# Patient Record
Sex: Male | Born: 1970
Health system: Southern US, Community
[De-identification: ages and names within clinical notes are randomized; demographics above are authoritative.]

## PROBLEM LIST (undated history)

## (undated) DIAGNOSIS — I10 Essential (primary) hypertension: Secondary | ICD-10-CM

## (undated) DIAGNOSIS — G7001 Myasthenia gravis with (acute) exacerbation: Secondary | ICD-10-CM

## (undated) DIAGNOSIS — G510 Bell's palsy: Secondary | ICD-10-CM

## (undated) DIAGNOSIS — E669 Obesity, unspecified: Secondary | ICD-10-CM

## (undated) DIAGNOSIS — J45909 Unspecified asthma, uncomplicated: Secondary | ICD-10-CM

## (undated) HISTORY — DX: Bell's palsy: G51.0

## (undated) HISTORY — DX: Myasthenia gravis with (acute) exacerbation: G70.01

## (undated) HISTORY — DX: Essential (primary) hypertension: I10

## (undated) HISTORY — PX: OTHER SURGICAL HISTORY: SHX169

## (undated) HISTORY — DX: Obesity, unspecified: E66.9

---

## 2010-05-01 ENCOUNTER — Emergency Department (HOSPITAL_COMMUNITY): Payer: Self-pay

## 2010-05-01 ENCOUNTER — Emergency Department (HOSPITAL_COMMUNITY)
Admission: EM | Admit: 2010-05-01 | Discharge: 2010-05-01 | Disposition: A | Discharge status: Home / Self Care | Payer: Self-pay | Attending: Emergency Medicine | Admitting: Emergency Medicine

## 2010-05-01 DIAGNOSIS — R109 Unspecified abdominal pain: Secondary | ICD-10-CM | POA: Insufficient documentation

## 2010-05-01 DIAGNOSIS — R12 Heartburn: Secondary | ICD-10-CM | POA: Insufficient documentation

## 2010-05-01 DIAGNOSIS — R10816 Epigastric abdominal tenderness: Secondary | ICD-10-CM | POA: Insufficient documentation

## 2010-05-01 LAB — URINALYSIS, ROUTINE W REFLEX MICROSCOPIC
Glucose, UA: NEGATIVE mg/dL
Leukocytes, UA: NEGATIVE
Specific Gravity, Urine: 1.027 (ref 1.005–1.030)
pH: 5 (ref 5.0–8.0)

## 2010-05-01 LAB — DIFFERENTIAL
Basophils Relative: 0 % (ref 0–1)
Eosinophils Absolute: 0 10*3/uL (ref 0.0–0.7)
Eosinophils Relative: 0 % (ref 0–5)
Lymphs Abs: 1.3 10*3/uL (ref 0.7–4.0)
Monocytes Relative: 4 % (ref 3–12)

## 2010-05-01 LAB — CBC
MCH: 26.9 pg (ref 26.0–34.0)
MCV: 81.9 fL (ref 78.0–100.0)
Platelets: 280 10*3/uL (ref 150–400)
RDW: 14.2 % (ref 11.5–15.5)
WBC: 8.8 10*3/uL (ref 4.0–10.5)

## 2010-05-01 LAB — COMPREHENSIVE METABOLIC PANEL
Albumin: 3.9 g/dL (ref 3.5–5.2)
BUN: 7 mg/dL (ref 6–23)
Creatinine, Ser: 0.78 mg/dL (ref 0.4–1.5)
GFR calc Af Amer: >60 mL/min (ref >60)
Total Protein: 7.3 g/dL (ref 6.0–8.3)

## 2010-05-01 LAB — URINE MICROSCOPIC-ADD ON

## 2010-11-08 ENCOUNTER — Emergency Department (HOSPITAL_COMMUNITY): Payer: Self-pay

## 2010-11-08 ENCOUNTER — Emergency Department (HOSPITAL_COMMUNITY)
Admission: EM | Admit: 2010-11-08 | Discharge: 2010-11-09 | Disposition: A | Discharge status: Home / Self Care | Payer: Self-pay | Attending: Emergency Medicine | Admitting: Emergency Medicine

## 2010-11-08 DIAGNOSIS — R1013 Epigastric pain: Secondary | ICD-10-CM | POA: Insufficient documentation

## 2010-11-08 DIAGNOSIS — H538 Other visual disturbances: Secondary | ICD-10-CM | POA: Insufficient documentation

## 2010-11-08 DIAGNOSIS — X58XXXA Exposure to other specified factors, initial encounter: Secondary | ICD-10-CM | POA: Insufficient documentation

## 2010-11-08 DIAGNOSIS — R112 Nausea with vomiting, unspecified: Secondary | ICD-10-CM | POA: Insufficient documentation

## 2010-11-08 DIAGNOSIS — S058X9A Other injuries of unspecified eye and orbit, initial encounter: Secondary | ICD-10-CM | POA: Insufficient documentation

## 2010-11-08 DIAGNOSIS — R29810 Facial weakness: Secondary | ICD-10-CM | POA: Insufficient documentation

## 2010-11-08 LAB — POCT I-STAT, CHEM 8
BUN: 6 mg/dL (ref 6–23)
Calcium, Ion: 1.21 mmol/L (ref 1.12–1.32)
Chloride: 102 meq/L (ref 96–112)
Creatinine, Ser: 0.8 mg/dL (ref 0.50–1.35)
Glucose, Bld: 125 mg/dL — ABNORMAL HIGH (ref 70–99)
HCT: 48 % (ref 39.0–52.0)
Hemoglobin: 16.3 g/dL (ref 13.0–17.0)
Potassium: 4.1 meq/L (ref 3.5–5.1)
Sodium: 142 meq/L (ref 135–145)
TCO2: 28 mmol/L (ref 0–100)

## 2010-11-08 LAB — CBC
HCT: 41.4 % (ref 39.0–52.0)
Hemoglobin: 13.9 g/dL (ref 13.0–17.0)
MCH: 27.5 pg (ref 26.0–34.0)
MCHC: 33.6 g/dL (ref 30.0–36.0)
MCV: 82 fL (ref 78.0–100.0)
Platelets: 304 K/uL (ref 150–400)
RBC: 5.05 MIL/uL (ref 4.22–5.81)
RDW: 13.6 % (ref 11.5–15.5)
WBC: 9.6 K/uL (ref 4.0–10.5)

## 2010-11-08 LAB — DIFFERENTIAL
Basophils Relative: 0 % (ref 0–1)
Eosinophils Absolute: 0 10*3/uL (ref 0.0–0.7)
Monocytes Relative: 5 % (ref 3–12)
Neutrophils Relative %: 89 % — ABNORMAL HIGH (ref 43–77)

## 2010-11-09 LAB — LIPASE, BLOOD: Lipase: 14 U/L (ref 11–59)

## 2010-11-18 ENCOUNTER — Emergency Department (HOSPITAL_COMMUNITY): Payer: Self-pay

## 2010-11-18 ENCOUNTER — Emergency Department (HOSPITAL_COMMUNITY)
Admission: EM | Admit: 2010-11-18 | Discharge: 2010-11-18 | Disposition: A | Discharge status: Home / Self Care | Payer: Self-pay | Attending: Emergency Medicine | Admitting: Emergency Medicine

## 2010-11-18 DIAGNOSIS — R1013 Epigastric pain: Secondary | ICD-10-CM | POA: Insufficient documentation

## 2010-11-18 DIAGNOSIS — R112 Nausea with vomiting, unspecified: Secondary | ICD-10-CM | POA: Insufficient documentation

## 2010-11-18 DIAGNOSIS — K259 Gastric ulcer, unspecified as acute or chronic, without hemorrhage or perforation: Secondary | ICD-10-CM | POA: Insufficient documentation

## 2010-11-18 DIAGNOSIS — R12 Heartburn: Secondary | ICD-10-CM | POA: Insufficient documentation

## 2010-11-18 LAB — COMPREHENSIVE METABOLIC PANEL
AST: 13 U/L (ref 0–37)
Alkaline Phosphatase: 58 U/L (ref 39–117)
BUN: 9 mg/dL (ref 6–23)
CO2: 28 mEq/L (ref 19–32)
Chloride: 100 mEq/L (ref 96–112)
Creatinine, Ser: 0.85 mg/dL (ref 0.50–1.35)
GFR calc non Af Amer: >90 mL/min (ref >90)
Total Bilirubin: 0.7 mg/dL (ref 0.3–1.2)

## 2010-11-18 LAB — URINALYSIS, ROUTINE W REFLEX MICROSCOPIC
Ketones, ur: 15 mg/dL — AB
Nitrite: NEGATIVE
Protein, ur: NEGATIVE mg/dL
Specific Gravity, Urine: 1.023 (ref 1.005–1.030)
Urobilinogen, UA: 0.2 mg/dL (ref 0.0–1.0)

## 2010-11-18 LAB — CBC
HCT: 40.7 % (ref 39.0–52.0)
MCHC: 33.9 g/dL (ref 30.0–36.0)
MCV: 81.1 fL (ref 78.0–100.0)
RDW: 13 % (ref 11.5–15.5)

## 2010-11-18 LAB — DIFFERENTIAL
Eosinophils Relative: 0 % (ref 0–5)
Lymphocytes Relative: 10 % — ABNORMAL LOW (ref 12–46)
Lymphs Abs: 0.9 10*3/uL (ref 0.7–4.0)
Monocytes Absolute: 0.7 10*3/uL (ref 0.1–1.0)
Monocytes Relative: 7 % (ref 3–12)

## 2010-11-18 LAB — LIPASE, BLOOD: Lipase: 13 U/L (ref 11–59)

## 2012-03-04 DIAGNOSIS — G510 Bell's palsy: Secondary | ICD-10-CM | POA: Insufficient documentation

## 2012-03-06 ENCOUNTER — Other Ambulatory Visit (HOSPITAL_COMMUNITY): Payer: Self-pay | Admitting: Neurology

## 2012-03-06 DIAGNOSIS — G7000 Myasthenia gravis without (acute) exacerbation: Secondary | ICD-10-CM

## 2012-03-06 DIAGNOSIS — D4989 Neoplasm of unspecified behavior of other specified sites: Secondary | ICD-10-CM

## 2012-03-12 ENCOUNTER — Ambulatory Visit (HOSPITAL_COMMUNITY)
Admission: RE | Admit: 2012-03-12 | Discharge: 2012-03-12 | Disposition: A | Discharge status: Home / Self Care | Payer: Self-pay | Source: Ambulatory Visit | Attending: Neurology | Admitting: Neurology

## 2012-03-12 DIAGNOSIS — G7000 Myasthenia gravis without (acute) exacerbation: Secondary | ICD-10-CM | POA: Insufficient documentation

## 2012-03-12 DIAGNOSIS — D4989 Neoplasm of unspecified behavior of other specified sites: Secondary | ICD-10-CM

## 2012-03-12 MED ORDER — IOHEXOL 300 MG/ML  SOLN
80.0000 mL | Freq: Once | INTRAMUSCULAR | Status: AC | PRN
  Administered 2012-03-12: 80 mL via INTRAVENOUS

## 2012-06-18 ENCOUNTER — Encounter: Payer: Self-pay | Admitting: Neurology

## 2012-06-18 DIAGNOSIS — G510 Bell's palsy: Secondary | ICD-10-CM

## 2012-06-19 ENCOUNTER — Ambulatory Visit: Payer: Self-pay | Admitting: Neurology

## 2013-04-23 ENCOUNTER — Other Ambulatory Visit: Payer: Self-pay | Admitting: Neurology

## 2013-04-24 ENCOUNTER — Telehealth: Payer: Self-pay

## 2013-04-24 MED ORDER — PREDNISONE 20 MG PO TABS: 20.0000 mg | ORAL_TABLET | Freq: Every day | ORAL | Status: DC

## 2013-04-24 NOTE — Telephone Encounter (Signed)
I called patient. The patient has not been seen to this office since January 2014, this was the original new patient appointment, and the patient was diagnosed with myasthenia gravis. The patient is on prednisone 20 mg daily. I will give him another month on the prescription, but the patient must be seen through this office. I'll try to get a revisit set up.

## 2013-04-24 NOTE — Telephone Encounter (Signed)
[facility] on [address] is requesting a refill on Prednisone 20mg  one daily.  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  He had an appt in May 2014, but cancelled it, and has not been seen since original OV.  I attempted to contact the patient to no avail.  Would you like to fill med?  Please advise.

## 2013-04-27 ENCOUNTER — Telehealth: Payer: Self-pay | Admitting: *Deleted

## 2013-04-27 ENCOUNTER — Encounter: Payer: Self-pay | Admitting: *Deleted

## 2013-04-27 NOTE — Telephone Encounter (Signed)
A letter has been mailed to address on file. Neither contact number are working numbers.

## 2013-10-28 IMAGING — CT CT CHEST W/ CM
4 series · 18 of 30 positions shown, 19 images · IV contrast (80ml omni 300)
Comparison: Chest radiographs dated 11/08/2010

CLINICAL DATA: Newly diagnosed myasthenia gravis, evaluate for
thymoma

CT CHEST WITH CONTRAST
TECHNIQUE: Multidetector CT imaging of the chest was performed
following the standard protocol during bolus administration of
intravenous contrast.
Contrast: 80mL OMNIPAQUE IOHEXOL 300 MG/ML  SOLN

[Series 2: routine chest · axial · 0.76mm/px · z∈[-228,-98]mm · 4 of 54 slices shown, 5 images]
[im 14/54  mediastinal]
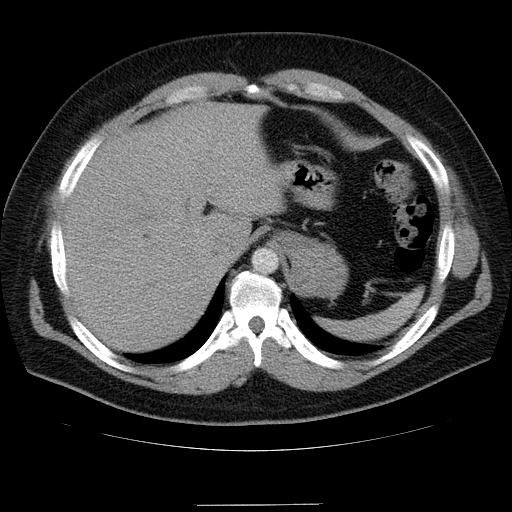
[im 14/54  lung]
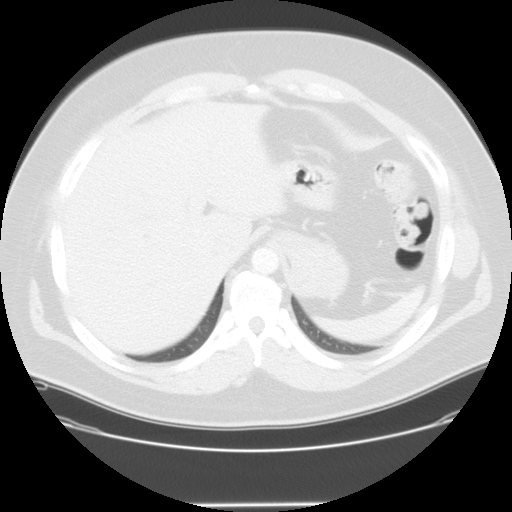
[im 27/54  lung]
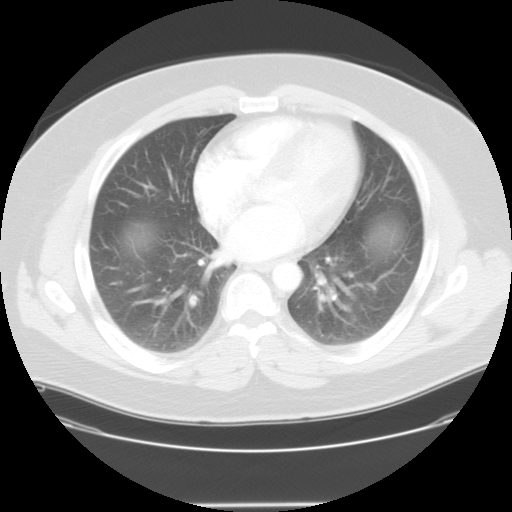
[im 32/54  lung]
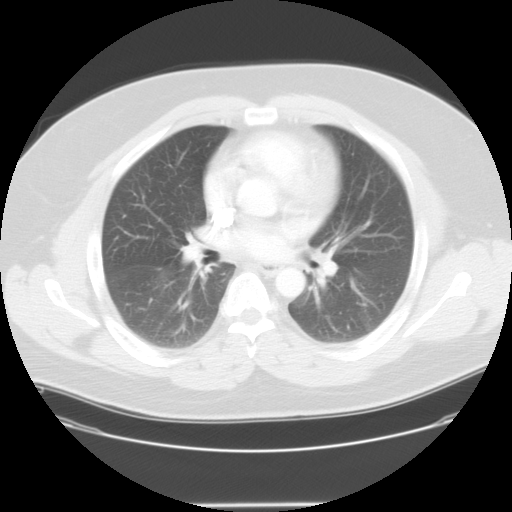
[im 40/54  lung]
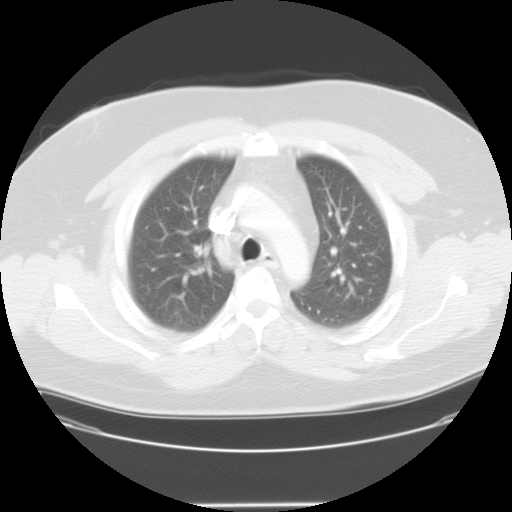

[Series 3: recon 2: routine chest · axial · 0.76mm/px · z∈[-183,-108]mm · 3 of 47 slices shown]
[im 16/47  lung]
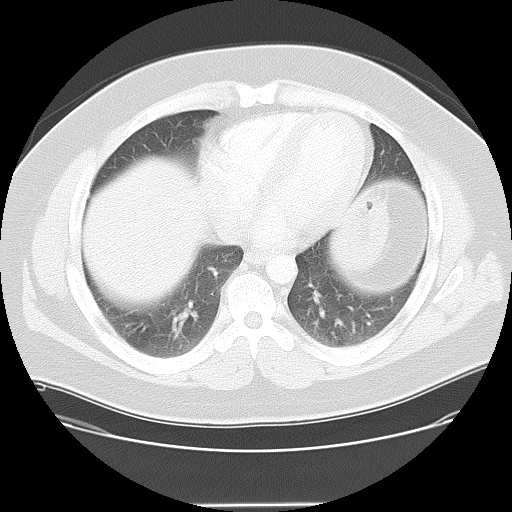
[im 25/47  lung]
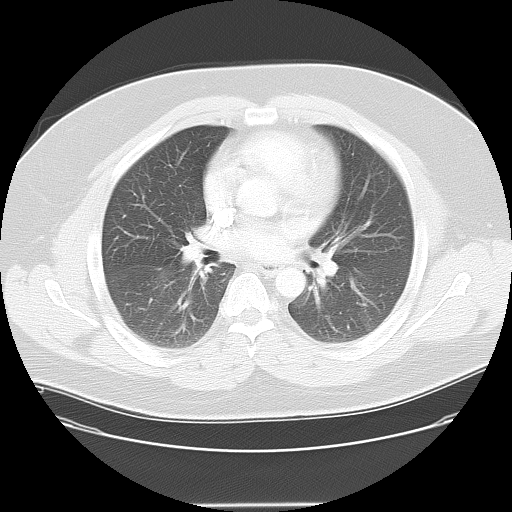
[im 31/47  lung]
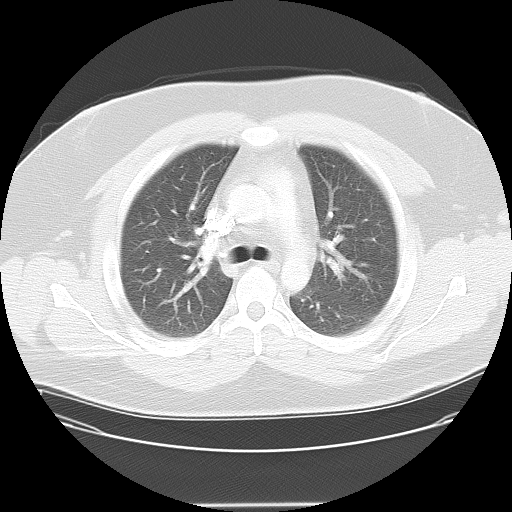

[Series 400: coronals · coronal · 0.76mm/px · 3 of 102 slices shown]
[im 13/102  lung]
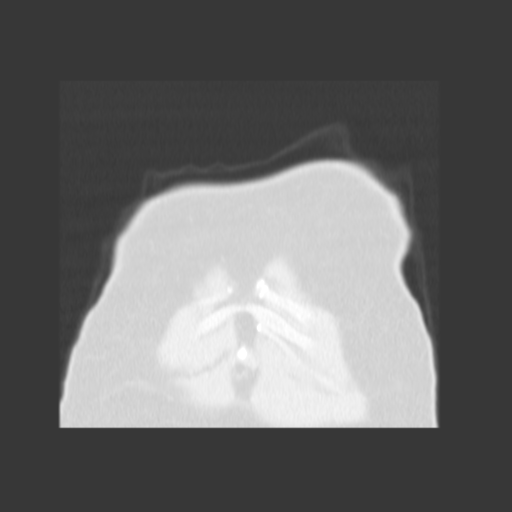
[im 26/102  lung]
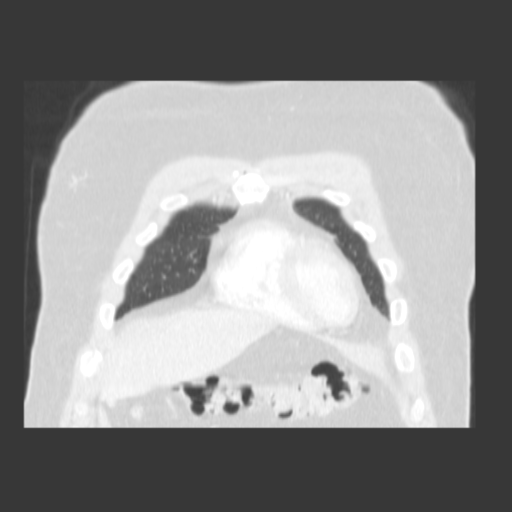
[im 38/102  lung]
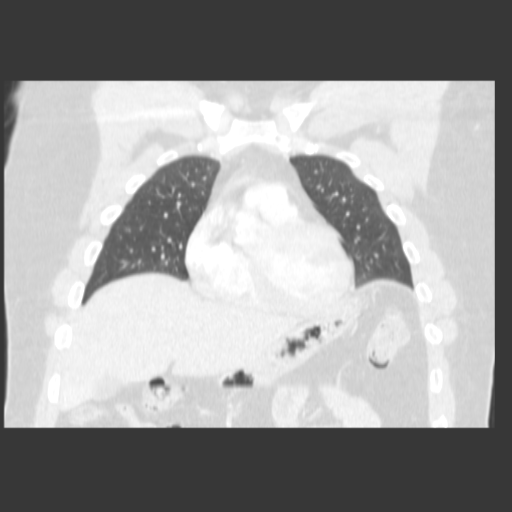

[Series 401: sagittals · sagittal · 0.76mm/px · 8 of 106 slices shown]
[im 12/106  lung]
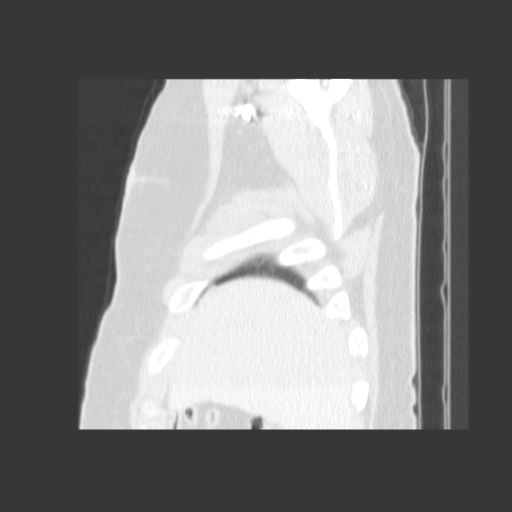
[im 24/106  lung]
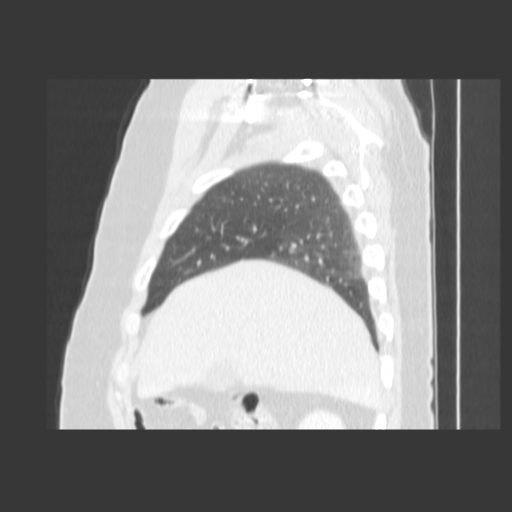
[im 36/106  lung]
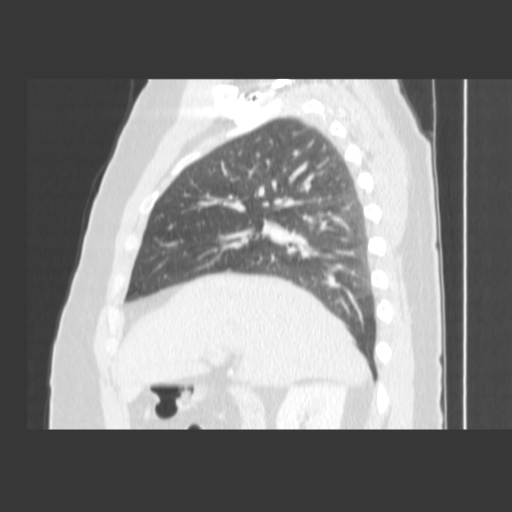
[im 47/106  lung]
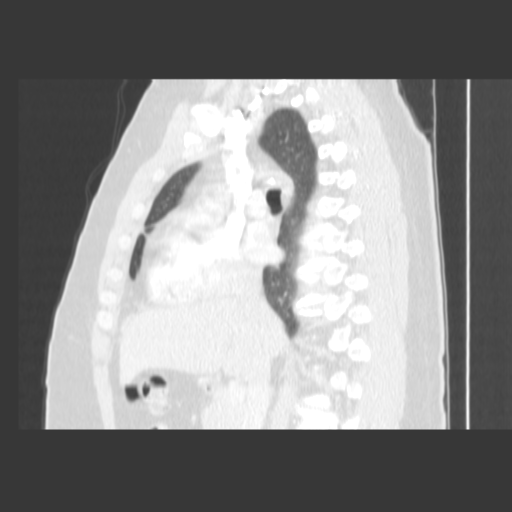
[im 59/106  lung]
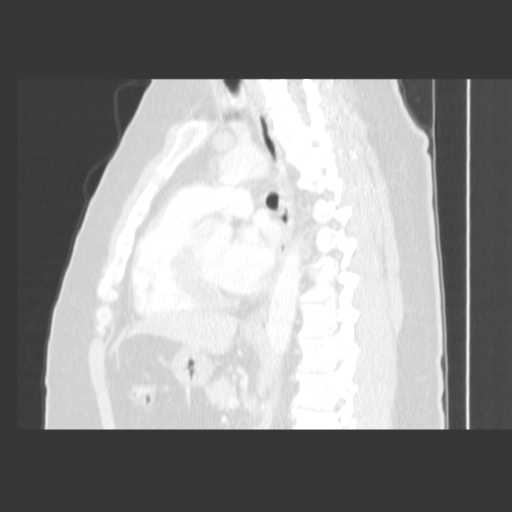
[im 71/106  lung]
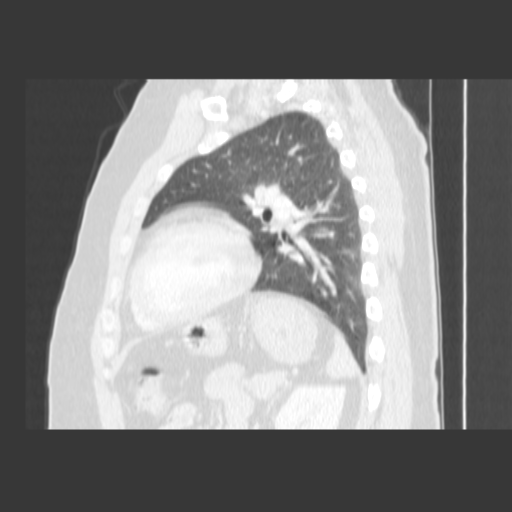
[im 82/106  lung]
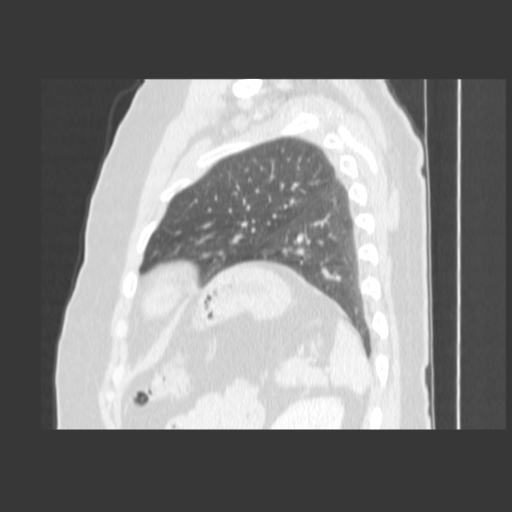
[im 94/106  lung]
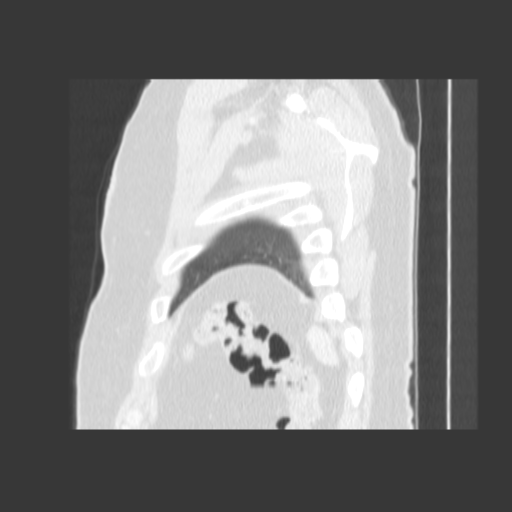

[18 of 30 positions shown; findings below may reference images not displayed]

FINDINGS: Lungs are clear.  No pulmonary nodules. No pleural
effusion or pneumothorax.

The visualized thyroid is unremarkable.

No anterior mediastinal mass or findings suspicious for thymoma.

The heart is normal in size.  No pericardial effusion.

No suspicious mediastinal or hilar lymphadenopathy.  Small
bilateral axillary lymph nodes measuring up to 10 mm on the left
(series 2/image 17), likely reactive.

Mild degenerative changes of the visualized thoracolumbar spine.
IMPRESSION: No evidence of acute cardiopulmonary disease.

Specifically, no findings to suggest thymoma.

## 2014-02-11 ENCOUNTER — Ambulatory Visit (INDEPENDENT_AMBULATORY_CARE_PROVIDER_SITE_OTHER): Payer: 59 | Admitting: Family Medicine

## 2014-02-11 VITALS — BP 150/90 | HR 87 | Temp 98.4°F | Resp 16 | Ht 66.0 in | Wt 255.0 lb

## 2014-02-11 DIAGNOSIS — I10 Essential (primary) hypertension: Secondary | ICD-10-CM

## 2014-02-11 LAB — LIPID PANEL
CHOL/HDL RATIO: 7.9 ratio
CHOLESTEROL: 309 mg/dL — AB (ref 0–200)
HDL: 39 mg/dL — AB (ref >39)
LDL CALC: 240 mg/dL — AB (ref 0–99)
TRIGLYCERIDES: 152 mg/dL — AB (ref <150)
VLDL: 30 mg/dL (ref 0–40)

## 2014-02-11 LAB — COMPREHENSIVE METABOLIC PANEL
ALBUMIN: 4.3 g/dL (ref 3.5–5.2)
ALT: 22 U/L (ref 0–53)
AST: 18 U/L (ref 0–37)
Alkaline Phosphatase: 59 U/L (ref 39–117)
BILIRUBIN TOTAL: 0.7 mg/dL (ref 0.2–1.2)
BUN: 10 mg/dL (ref 6–23)
CALCIUM: 9.8 mg/dL (ref 8.4–10.5)
CO2: 31 meq/L (ref 19–32)
CREATININE: 0.88 mg/dL (ref 0.50–1.35)
Chloride: 102 mEq/L (ref 96–112)
GLUCOSE: 93 mg/dL (ref 70–99)
POTASSIUM: 4.8 meq/L (ref 3.5–5.3)
Sodium: 139 mEq/L (ref 135–145)
Total Protein: 7.8 g/dL (ref 6.0–8.3)

## 2014-02-11 MED ORDER — LISINOPRIL-HYDROCHLOROTHIAZIDE 10-12.5 MG PO TABS: 1.0000 | ORAL_TABLET | Freq: Every day | ORAL | Status: DC

## 2014-02-11 NOTE — Progress Notes (Addendum)
  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 He is on vacation the next week.  Objective: Blood pressure is noted. Chest clear. Heart regular without murmurs.  Assessment: Hypertension unsatisfactory control  Plan: Lisinopril HCT Work hard on weight loss, exercise, and salt reduction.

## 2014-02-11 NOTE — Patient Instructions (Addendum)
Take caution with your diet as we discussed  Get regular exercise  Avoid excessive salt  Take the lisinopril HCT one pill every morning for the blood pressure.  Return next Wednesday between 8 AM and 2 PM and asked for Dr. Linna Darner      Hypertension Hypertension, commonly called high blood pressure, is when the force of blood pumping through your arteries is too strong. Your arteries are the blood vessels that carry blood from your heart throughout your body. A blood pressure reading consists of a higher number over a lower number, such as 110/72. The higher number (systolic) is the pressure inside your arteries when your heart pumps. The lower number (diastolic) is the pressure inside your arteries when your heart relaxes. Ideally you want your blood pressure below 120/80. Hypertension forces your heart to work harder to pump blood. Your arteries may become narrow or stiff. Having hypertension puts you at risk for heart disease, stroke, and other problems.  RISK FACTORS Some risk factors for high blood pressure are controllable. Others are not.  Risk factors you cannot control include:   Race. You may be at higher risk if you are African American.  Age. Risk increases with age.  Gender. Men are at higher risk than women before age 83 years. After age 59, women are at higher risk than men. Risk factors you can control include:  Not getting enough exercise or physical activity.  Being overweight.  Getting too much fat, sugar, calories, or salt in your diet.  Drinking too much alcohol. SIGNS AND SYMPTOMS Hypertension does not usually cause signs or symptoms. Extremely high blood pressure (hypertensive crisis) may cause headache, anxiety, shortness of breath, and nosebleed. DIAGNOSIS  To check if you have hypertension, your health care provider will measure your blood pressure while you are seated, with your arm held at the level of your heart. It should be measured at least twice  using the same arm. Certain conditions can cause a difference in blood pressure between your right and left arms. A blood pressure reading that is higher than normal on one occasion does not mean that you need treatment. If one blood pressure reading is high, ask your health care provider about having it checked again. TREATMENT  Treating high blood pressure includes making lifestyle changes and possibly taking medicine. Living a healthy lifestyle can help lower high blood pressure. You may need to change some of your habits. Lifestyle changes may include:  Following the DASH diet. This diet is high in fruits, vegetables, and whole grains. It is low in salt, red meat, and added sugars.  Getting at least 2 hours of brisk physical activity every week.  Losing weight if necessary.  Not smoking.  Limiting alcoholic beverages.  Learning ways to reduce stress. If lifestyle changes are not enough to get your blood pressure under control, your health care provider may prescribe medicine. You may need to take more than one. Work closely with your health care provider to understand the risks and benefits. HOME CARE INSTRUCTIONS  Have your blood pressure rechecked as directed by your health care provider.   Take medicines only as directed by your health care provider. Follow the directions carefully. Blood pressure medicines must be taken as prescribed. The medicine does not work as well when you skip doses. Skipping doses also puts you at risk for problems.   Do not smoke.   Monitor your blood pressure at home as directed by your health care provider. [place]  CARE IF:   You think you are having a reaction to medicines taken.  You have recurrent headaches or feel dizzy.  You have swelling in your ankles.  You have trouble with your vision. SEEK IMMEDIATE MEDICAL CARE IF:  You develop a severe headache or confusion.  You have unusual weakness, numbness, or feel faint.  You  have severe chest or abdominal pain.  You vomit repeatedly.  You have trouble breathing. MAKE SURE YOU:   Understand these instructions.  Will watch your condition.  Will get help right away if you are not doing well or get worse. Document Released: 01/29/2005 Document Revised: 06/15/2013 Document Reviewed: 11/21/2012 [facility] Patient Information 2015 [place], [place]. This information is not intended to replace advice given to you by your health care provider. Make sure you discuss any questions you have with your health care provider.

## 2014-02-12 LAB — TSH: TSH: 0.922 u[IU]/mL (ref 0.350–4.500)

## 2014-02-17 ENCOUNTER — Ambulatory Visit (INDEPENDENT_AMBULATORY_CARE_PROVIDER_SITE_OTHER): Payer: 59 | Admitting: Family Medicine

## 2014-02-17 VITALS — BP 124/88 | HR 85 | Temp 98.7°F | Resp 20 | Ht 65.5 in | Wt 251.4 lb

## 2014-02-17 DIAGNOSIS — I10 Essential (primary) hypertension: Secondary | ICD-10-CM

## 2014-02-17 MED ORDER — LISINOPRIL-HYDROCHLOROTHIAZIDE 20-25 MG PO TABS: 1.0000 | ORAL_TABLET | Freq: Every day | ORAL | Status: DC

## 2014-02-17 NOTE — Patient Instructions (Signed)
Change the blood pressure medicine to lisinopril/HCT 20/25 (210/12.5). When you run out of the 10 mg pill pick up the 20 mg pills.   Return in about one month and see Dr. Nyoka Cowden. It would be good if you can get an appointment to see him in the appointment center next door.   Wait until about Friday to go try to past year test. If there are any problems let me know.

## 2014-02-17 NOTE — Progress Notes (Signed)
Subjective patient was here last week for his blood pressure. 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 He has been taking lisinopril HCT 10/12.5 every morning. 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 Otherwise is tolerating the medicine well.  Objective: Blood pressure a little elevated when he came in, on repeat 124/88.  Assessment: Hypertension, still somewhat borderline  Plan: Increase the lisinopril HCT to 20/25. He is going to double up on the low-dose pills for the next couple weeks and see how they are doing before getting the prescription for the new pills filled. I think after another 3 days or so on this dose that he should be able to pass his test hopefully. If he is not doing better, we may have to add some amlodipine.  He has chosen Dr. Carlota Raspberry as his primary care doctor, so suggested that he make an appointment to see him in 1 month.

## 2014-03-15 ENCOUNTER — Ambulatory Visit (INDEPENDENT_AMBULATORY_CARE_PROVIDER_SITE_OTHER): Payer: 59 | Admitting: Emergency Medicine

## 2014-03-15 VITALS — BP 132/90 | HR 90 | Temp 98.6°F | Resp 18 | Ht 67.0 in | Wt 258.0 lb

## 2014-03-15 DIAGNOSIS — G510 Bell's palsy: Secondary | ICD-10-CM

## 2014-03-15 DIAGNOSIS — I10 Essential (primary) hypertension: Secondary | ICD-10-CM

## 2014-03-15 MED ORDER — PREDNISONE 20 MG PO TABS: ORAL_TABLET | ORAL | Status: DC

## 2014-03-15 NOTE — Progress Notes (Signed)
Subjective:    Patient ID: Brent Cruz, male    DOB: [birth year], 44 y.o.   MRN: [id number]  HPI  This is a 44 year old male with PMH HTN and bells palsy who is presenting with left eyelid drooping x 2 days. He was diagnosed with bells palsy about 2 years ago and states he his facial strength never got back to normal although it did improve slightly from initial diagnosis. Since his diagnosis he has had a few "flares" where his eyelid "starts to feel lazy". He states for the past 2 days his left eyelid is drooping more and when he is tired his eyelid droops more and affects his vision. His other facial muscles have not been more affected in the past few days. He states in the past he has been prescribed prednisone and pyridostigmine which have been helpful. He is a Administrator. He denies blurry vision or eye pain. He has never had a scan of his head or seen a neurologist.  He also reports a few months back at his DOT exam he was diagnosed with HTN and put on lisinopril-HCTZ. He is wondering if he needs to keep taking that medication. He states he is taking the BP med inconsistently.   Review of Systems  Constitutional: Negative for fever and chills.  HENT: Negative for drooling, ear pain, trouble swallowing and voice change.   Eyes: Negative for pain, discharge and visual disturbance.  Gastrointestinal: Negative for nausea and vomiting.  Skin: Negative for rash.    Patient Active Problem List   Diagnosis Date Noted  . Essential hypertension 03/15/2014  . Bell's palsy 03/04/2012   Prior to Admission medications   Medication Sig Start Date End Date Taking? Authorizing Provider  lisinopril-hydrochlorothiazide (PRINZIDE,ZESTORETIC) 10-12.5 MG per tablet Take 1 tablet by mouth daily. 02/11/14  Yes Posey Boyer, MD  lisinopril-hydrochlorothiazide (PRINZIDE,ZESTORETIC) 20-25 MG per tablet Take 1 tablet by mouth daily. 02/17/14  Yes Posey Boyer, MD   No Known Allergies  Patient's social  and family history were reviewed.      Objective:   Physical Exam  Constitutional: He is oriented to person, place, and time. He appears well-developed and well-nourished. No distress.  HENT:  Head: Normocephalic and atraumatic.  Right Ear: Hearing, tympanic membrane, external ear and ear canal normal.  Left Ear: Hearing, tympanic membrane, external ear and ear canal normal.  Nose: Nose normal.  Mouth/Throat: Uvula is midline, oropharynx is clear and moist and mucous membranes are normal.  Eyes: Conjunctivae and EOM are normal. Pupils are equal, round, and reactive to light. Right eye exhibits no discharge. Left eye exhibits no discharge. No scleral icterus.  Left upper lid ptosis   Neck: Trachea normal.  Cardiovascular: Normal rate, regular rhythm, normal heart sounds, intact distal pulses and normal pulses.   No murmur heard. Pulmonary/Chest: Effort normal and breath sounds normal. No respiratory distress. He has no wheezes. He has no rhonchi. He has no rales.  Musculoskeletal: Normal range of motion.  Lymphadenopathy:       Head (right side): No submental, no submandibular and no tonsillar adenopathy present.       Head (left side): No submental, no submandibular and no tonsillar adenopathy present.    He has no cervical adenopathy.  Neurological: He is alert and oriented to person, place, and time. A cranial nerve deficit is present. No sensory deficit. Coordination and gait normal.  Left sided facial weakness in all CNVII distributions, 2-3/5 strength.  Skin:  Skin is warm, dry and intact. No lesion and no rash noted.  Psychiatric: He has a normal mood and affect. His speech is normal and behavior is normal. Thought content normal.   BP 144/90 mmHg  Pulse 90  Temp(Src) 98.6 F (37 C) (Oral)  Resp 18  Ht 5\' 7"  (1.702 m)  Wt 258 lb (117.028 kg)  BMI 40.40 kg/m2  SpO2 99%     Assessment & Plan:  1. Essential hypertension BP 144/90 on arrival, recheck 132/90. Advised pt to  take BP medication consistently and check BP at home and return if BP consistently >140/90.  2. Bell's palsy Prescribed prednisone as this has helped before. D/t residual facial weakness will send to neurology for evaluation and work up. He most likely needs a scan to rule out other causes.  - predniSONE (DELTASONE) 20 MG tablet; Take 3 PO QAM x3days, 2 PO QAM x3days, 1 PO QAM x3days  Dispense: 18 tablet; Refill: 0 - Ambulatory referral to Neurology   Benjaman Pott. Drenda Freeze, M[facility]  03/15/2014

## 2014-03-15 NOTE — Progress Notes (Signed)
  Medical screening examination/treatment/procedure(s) were performed by non-physician practitioner and as supervising physician I was immediately available for consultation/collaboration.     

## 2014-03-15 NOTE — Patient Instructions (Signed)
Take prednisone until finished. Use lubricating eye drops for dry eyes and before sleep. Keep taking blood pressure medication daily. You will get a phone call to make appointment with neurology. Return with any problems/concerns.

## 2014-03-30 ENCOUNTER — Encounter: Payer: Self-pay | Admitting: Neurology

## 2014-03-30 ENCOUNTER — Ambulatory Visit (INDEPENDENT_AMBULATORY_CARE_PROVIDER_SITE_OTHER): Payer: 59 | Admitting: Neurology

## 2014-03-30 VITALS — BP 152/92 | HR 114 | Ht 65.0 in | Wt 253.2 lb

## 2014-03-30 DIAGNOSIS — G7001 Myasthenia gravis with (acute) exacerbation: Secondary | ICD-10-CM

## 2014-03-30 HISTORY — DX: Myasthenia gravis with (acute) exacerbation: G70.01

## 2014-03-30 MED ORDER — PREDNISONE 20 MG PO TABS: 20.0000 mg | ORAL_TABLET | Freq: Every day | ORAL | Status: DC

## 2014-03-30 MED ORDER — PYRIDOSTIGMINE BROMIDE 60 MG PO TABS: 30.0000 mg | ORAL_TABLET | Freq: Three times a day (TID) | ORAL | Status: DC

## 2014-03-30 MED ORDER — PREDNISONE 5 MG PO TABS: 15.0000 mg | ORAL_TABLET | Freq: Every day | ORAL | Status: DC

## 2014-03-30 NOTE — Patient Instructions (Addendum)
  With the prednisone, stay on the 20 mg for 6 weeks, then we will go to 15 mg daily (3 of the 5 mg tablets daily), I will follow-up in 3 or 4 months. You can take the Mestinon one half tablet 3 times daily. Call me if you have any problems. Watch out for diarrhea or stomach cramps on the Mestinon.  Myasthenia Gravis Myasthenia gravis is a disease that causes muscle weakness throughout the body. The muscles affected are the ones we can control (voluntary muscles). An example of a voluntary muscle is your hand muscles. You can control the muscles to make the hand pick something up. An example of an involuntary muscle is the heart. The heart beats without any direction from you.  Myasthenia Gravis is thought to be an autoimmune disease. That means that normal defenses of the body begin to attack the body. In this case, the immune system begins to attack cells located at the junctions of the muscles and the nerves. Women are affected more often. Women are affected at a younger age than men. Babies born to affected women frequently develop symptoms at an early age. SYMPTOMS Initially in the disease, the facial muscles are affected first. After this, a person may develop droopy eyelids. They may have difficulty controlling facial muscles. They may have problems chewing. Swallowing and speaking may become impaired. The weakness gradually spreads to the arms and legs. It begins to affect breathing. Sometimes, the symptoms lessen or go away without any apparent cause. DIAGNOSIS  Diagnosis can be made with blood tests. Tests such as electromyography may be done to examine the electrical activity in the muscle. An improvement in symptoms after having an anti-cholinesterase drug helps confirm the diagnosis.  TREATMENT  Medicines are usually prescribed as the first treatment. These medicines help, but they do not cure the disease. A plasma cleansing procedure (plasmapheresis) can be used to treat a crisis. It can also  be used to prepare a person for surgery. This procedure produces short-term improvement. Some cases are helped by removing the thymus gland. Steroids are used for short-term benefits. Document Released: 05/07/2000 Document Revised: 04/23/2011 Document Reviewed: 04/01/2013 [facility] Patient Information 2015 [place], [place]. This information is not intended to replace advice given to you by your health care provider. Make sure you discuss any questions you have with your health care provider.

## 2014-03-30 NOTE — Progress Notes (Signed)
Reason for visit: Myasthenia gravis  Brent Cruz is an 44 y.o. male  History of present illness:  Brent Cruz is a 44 year old right-handed white male with a history of myasthenia gravis. He has had a left-sided Bell's palsy, but he developed recurring weakness and was seen in January 2014. The patient was found to have elevated acetylcholine receptor antibody levels, and a CT scan of the chest was unremarkable. The patient placed on prednisone and Mestinon, but he was lost to follow-up. The patient apparently has not been on any medication for least a year or year and a half. He has done quite well until about 2 weeks ago when he had recurrence of left facial weakness. He has had ptosis of the left eye that may close that eye. The patient denies any overt double vision. He denies any weakness of the extremities. He has not had any weakness with chewing, or change in speech or swallowing. He returns for an evaluation. He is back on prednisone, currently on 20 mg daily. The prednisone has improved his symptoms.  Past Medical History  Diagnosis Date  . Bell's palsy     Recurrent  . Obesity   . Hypertension   . Myasthenia gravis with exacerbation 03/30/2014    History reviewed. No pertinent past surgical history.  Family History  Problem Relation Age of Onset  . Glaucoma Father   . Diabetes Paternal Grandfather     Social history:  reports that he quit smoking about 25 years ago. He has never used smokeless tobacco. He reports that he drinks alcohol. He reports that he does not use illicit drugs.   No Known Allergies  Medications:  Current Outpatient Prescriptions on File Prior to Visit  Medication Sig Dispense Refill  . lisinopril-hydrochlorothiazide (PRINZIDE,ZESTORETIC) 10-12.5 MG per tablet Take 1 tablet by mouth daily. 30 tablet 3   No current facility-administered medications on file prior to visit.    ROS:  Out of a complete 14 system review of symptoms, the patient  complains only of the following symptoms, and all other reviewed systems are negative.  Blurred vision, double vision  Blood pressure 152/92, pulse 114, height 5\' 5"  (1.651 m), weight 253 lb 3.2 oz (114.851 kg).  Physical Exam  General: The patient is alert and cooperative at the time of the examination. The patient is moderately obese.  Skin: No significant peripheral edema is noted.   Neurologic Exam  Mental status: The patient is oriented x 3.  Cranial nerves: Facial symmetry is not present. There is facial asymmetry, mild left facial weakness. Speech is normal, no aphasia or dysarthria is noted. Extraocular movements are full. Visual fields are full. With superior gaze for 1 minute, no increase in left-sided ptosis or divergence of gaze was seen. No subjective double vision was noted.  Motor: The patient has good strength in all 4 extremities. With arms outstretched 1 minute, no fatigable weakness of the deltoid muscles was noted.  Sensory examination: Soft touch sensation on the face, arms, and legs was symmetric.  Coordination: The patient has good finger-nose-finger and heel-to-shin bilaterally.  Gait and station: The patient has a normal gait. Tandem gait is normal. Romberg is negative. No drift is seen.  Reflexes: Deep tendon reflexes are symmetric.   Assessment/Plan:  1. Myasthenia gravis  The patient has had minimal features with myasthenia, mainly associated with ptosis involving the left eye. The patient is back on prednisone, he will continue on 20 mg dose for least 6 weeks,  and then drop the dose to 15 mg. The patient will be given Mestinon taking 30 mg 3 times daily. He will follow-up in about 3-4 months. We will try to get the prednisone dose as low as possible.   Brent Alexanders MD 03/30/2014 6:54 PM  [facility] [address] [place], [place] [address]  Phone [phone] Fax [phone]

## 2014-09-27 ENCOUNTER — Ambulatory Visit: Payer: 59 | Admitting: Neurology

## 2014-09-27 ENCOUNTER — Telehealth: Payer: Self-pay

## 2014-09-27 NOTE — Telephone Encounter (Signed)
Patient did not come to a f/u appointment today.  

## 2014-10-05 ENCOUNTER — Encounter: Payer: Self-pay | Admitting: Neurology

## 2014-12-13 ENCOUNTER — Other Ambulatory Visit: Payer: Self-pay | Admitting: Family Medicine

## 2015-02-23 ENCOUNTER — Other Ambulatory Visit: Payer: Self-pay | Admitting: Family Medicine

## 2015-02-24 ENCOUNTER — Other Ambulatory Visit: Payer: Self-pay | Admitting: Family Medicine

## 2015-02-26 ENCOUNTER — Other Ambulatory Visit: Payer: Self-pay | Admitting: Family Medicine

## 2015-02-28 ENCOUNTER — Other Ambulatory Visit: Payer: Self-pay | Admitting: Family Medicine

## 2015-05-24 ENCOUNTER — Ambulatory Visit (INDEPENDENT_AMBULATORY_CARE_PROVIDER_SITE_OTHER): Payer: BLUE CROSS/BLUE SHIELD | Admitting: Family Medicine

## 2015-05-24 VITALS — BP 134/86 | HR 104 | Temp 99.3°F | Resp 18 | Ht 66.0 in | Wt 291.0 lb

## 2015-05-24 DIAGNOSIS — K439 Ventral hernia without obstruction or gangrene: Secondary | ICD-10-CM

## 2015-05-24 DIAGNOSIS — E785 Hyperlipidemia, unspecified: Secondary | ICD-10-CM

## 2015-05-24 DIAGNOSIS — I10 Essential (primary) hypertension: Secondary | ICD-10-CM

## 2015-05-24 DIAGNOSIS — R635 Abnormal weight gain: Secondary | ICD-10-CM | POA: Diagnosis not present

## 2015-05-24 LAB — POCT GLYCOSYLATED HEMOGLOBIN (HGB A1C): HEMOGLOBIN A1C: 5.9

## 2015-05-24 MED ORDER — LISINOPRIL-HYDROCHLOROTHIAZIDE 10-12.5 MG PO TABS: 1.0000 | ORAL_TABLET | Freq: Every day | ORAL | Status: DC

## 2015-05-24 NOTE — Patient Instructions (Addendum)
It is most important that you work harder on losing weight.   Exercise with a good walk for about 30 minutes at least 5 days a week   Cut out liquid calories   Cut out snacks   Save desserts for special occasions   Decrease your portions, especially of carbohydrates (bread., Pasta, potatoes, rice, etc.) And avoid excess fried and fatty foods   Be cautious when you eat out, pick restaurant square you will not be tempted overeat  Take your blood pressure medicine faithfully  We will let you know the results of your labs  Return in 3-4 months for a follow-up visit.     IF you received an x-ray today, you will receive an invoice from [facility]. Please contact [facility] at [phone] with questions or concerns regarding your invoice.   IF you received labwork today, you will receive an invoice from [employer]. Please contact [facility] at [phone] with questions or concerns regarding your invoice.   Our billing staff will not be able to assist you with questions regarding bills from these companies.  You will be contacted with the lab results as soon as they are available. The fastest way to get your results is to activate your My Chart account. Instructions are located on the last page of this paperwork. If you have not heard from [place] regarding the results in 2 weeks, please contact this office.

## 2015-05-24 NOTE — Progress Notes (Addendum)
Patient ID: Brent Cruz, male    DOB: 1971/01/25  Age: 45 y.o. MRN: [id number]  Chief Complaint  Patient presents with  . Follow-up    blood pressure    Subjective:   Patient is here for a blood pressure check in a form to be filled out saying that his blood pressure is okay. 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 He does not get a lot of exercise. He eats with his parents most of the time. Denies any headaches or dizziness or chest pains or shortness of breath. No GI or GU complaints. No musculoskeletal complaints.  Current allergies, medications, problem list, past/family and social histories reviewed.  Objective:  BP 134/86 mmHg  Pulse 104  Temp(Src) 99.3 F (37.4 C) (Oral)  Resp 18  Ht 5\' 6"  (1.676 m)  Wt 291 lb (131.997 kg)  BMI 46.99 kg/m2  SpO2 97%  Pleasant gentleman, alert and oriented. Eyes PERRLA. Neck supple without nodes or thyromegaly. Chest is clear to auscultation. Heart regular without any murmurs. He has a small ventral hernia when he strains. He has a large abdomen is morbidly obese. No edema.  Assessment & Plan:   Assessment: 1. Essential hypertension   2. Morbid obesity, unspecified obesity type ([place])   3. Weight gain   4. Ventral hernia without obstruction or gangrene   5. Hyperlipidemia       Plan: Hypertension, out of his medications, but his blood pressure readings are borderline satisfactory. Signed the form. It is very important that he gets back on his blood pressure medicine so that he doesn't run these borderline numbers.  Orders Placed This Encounter  Procedures  . Comprehensive metabolic panel  . Lipid panel  . POCT glycosylated hemoglobin (Hb A1C)    Meds ordered this encounter  Medications  . lisinopril-hydrochlorothiazide (PRINZIDE,ZESTORETIC) 10-12.5 MG tablet    Sig: Take 1 tablet by mouth daily.    Dispense:  90 tablet    Refill:  3    Results for orders placed or performed in visit on 05/24/15   Comprehensive metabolic panel  Result Value Ref Range   Sodium 143 135 - 146 mmol/L   Potassium 4.2 3.5 - 5.3 mmol/L   Chloride 104 98 - 110 mmol/L   CO2 32 (H) 20 - 31 mmol/L   Glucose, Bld 100 (H) 65 - 99 mg/dL   BUN 9 7 - 25 mg/dL   Creat 0.78 0.60 - 1.35 mg/dL   Total Bilirubin 0.4 0.2 - 1.2 mg/dL   Alkaline Phosphatase 50 40 - 115 U/L   AST 12 10 - 40 U/L   ALT 14 9 - 46 U/L   Total Protein 7.0 6.1 - 8.1 g/dL   Albumin 3.9 3.6 - 5.1 g/dL   Calcium 9.3 8.6 - 10.3 mg/dL  Lipid panel  Result Value Ref Range   Cholesterol 248 (H) 125 - 200 mg/dL   Triglycerides 228 (H) <150 mg/dL   HDL 34 (L) >=40 mg/dL   Total CHOL/HDL Ratio 7.3 (H) <=5.0 Ratio   VLDL 46 (H) <30 mg/dL   LDL Cholesterol 168 (H) <130 mg/dL  POCT glycosylated hemoglobin (Hb A1C)  Result Value Ref Range   Hemoglobin A1C 5.9         Patient Instructions   It is most important that you work harder on losing weight.   Exercise with a good walk for about 30 minutes at least 5 days a week   Cut out liquid calories  Cut out snacks   Save desserts for special occasions   Decrease your portions, especially of carbohydrates (bread., Pasta, potatoes, rice, etc.) And avoid excess fried and fatty foods   Be cautious when you eat out, pick restaurant square you will not be tempted overeat  Take your blood pressure medicine faithfully  We will let you know the results of your labs  Return in 3-4 months for a follow-up visit.     IF you received an x-ray today, you will receive an invoice from [facility]. Please contact [facility] at [phone] with questions or concerns regarding your invoice.   IF you received labwork today, you will receive an invoice from [employer]. Please contact [facility] at [phone] with questions or concerns regarding your invoice.   Our billing staff will not be able to assist you with questions regarding bills from these  companies.  You will be contacted with the lab results as soon as they are available. The fastest way to get your results is to activate your My Chart account. Instructions are located on the last page of this paperwork. If you have not heard from [place] regarding the results in 2 weeks, please contact this office.         Return in about 4 months (around 09/23/2015).   HOPPER,DAVID, MD 05/27/2015

## 2015-05-25 LAB — COMPREHENSIVE METABOLIC PANEL
ALT: 14 U/L (ref 9–46)
AST: 12 U/L (ref 10–40)
Albumin: 3.9 g/dL (ref 3.6–5.1)
Alkaline Phosphatase: 50 U/L (ref 40–115)
BILIRUBIN TOTAL: 0.4 mg/dL (ref 0.2–1.2)
BUN: 9 mg/dL (ref 7–25)
CHLORIDE: 104 mmol/L (ref 98–110)
CO2: 32 mmol/L — AB (ref 20–31)
Calcium: 9.3 mg/dL (ref 8.6–10.3)
Creat: 0.78 mg/dL (ref 0.60–1.35)
GLUCOSE: 100 mg/dL — AB (ref 65–99)
POTASSIUM: 4.2 mmol/L (ref 3.5–5.3)
Sodium: 143 mmol/L (ref 135–146)
Total Protein: 7 g/dL (ref 6.1–8.1)

## 2015-05-25 LAB — LIPID PANEL
Cholesterol: 248 mg/dL — ABNORMAL HIGH (ref 125–200)
HDL: 34 mg/dL — AB (ref >=40)
LDL CALC: 168 mg/dL — AB (ref <130)
TRIGLYCERIDES: 228 mg/dL — AB (ref <150)
Total CHOL/HDL Ratio: 7.3 Ratio — ABNORMAL HIGH (ref <=5.0)
VLDL: 46 mg/dL — AB (ref <30)

## 2015-05-27 ENCOUNTER — Other Ambulatory Visit: Payer: Self-pay | Admitting: Family Medicine

## 2015-05-27 DIAGNOSIS — E785 Hyperlipidemia, unspecified: Secondary | ICD-10-CM

## 2015-05-27 MED ORDER — ROSUVASTATIN CALCIUM 5 MG PO TABS: 5.0000 mg | ORAL_TABLET | Freq: Every day | ORAL | Status: DC

## 2015-06-02 ENCOUNTER — Ambulatory Visit (INDEPENDENT_AMBULATORY_CARE_PROVIDER_SITE_OTHER): Payer: BLUE CROSS/BLUE SHIELD | Admitting: Physician Assistant

## 2015-06-02 ENCOUNTER — Emergency Department (HOSPITAL_COMMUNITY)
Admission: EM | Admit: 2015-06-02 | Discharge: 2015-06-02 | Disposition: A | Discharge status: Home / Self Care | Payer: BLUE CROSS/BLUE SHIELD | Attending: Emergency Medicine | Admitting: Emergency Medicine

## 2015-06-02 VITALS — BP 132/90 | HR 123 | Temp 100.9°F | Resp 20 | Ht 66.0 in | Wt 277.2 lb

## 2015-06-02 DIAGNOSIS — Z7982 Long term (current) use of aspirin: Secondary | ICD-10-CM | POA: Insufficient documentation

## 2015-06-02 DIAGNOSIS — R000 Tachycardia, unspecified: Secondary | ICD-10-CM | POA: Diagnosis not present

## 2015-06-02 DIAGNOSIS — J36 Peritonsillar abscess: Secondary | ICD-10-CM | POA: Diagnosis not present

## 2015-06-02 DIAGNOSIS — E669 Obesity, unspecified: Secondary | ICD-10-CM | POA: Diagnosis not present

## 2015-06-02 DIAGNOSIS — R0602 Shortness of breath: Secondary | ICD-10-CM | POA: Diagnosis not present

## 2015-06-02 DIAGNOSIS — Z79899 Other long term (current) drug therapy: Secondary | ICD-10-CM | POA: Insufficient documentation

## 2015-06-02 DIAGNOSIS — D72829 Elevated white blood cell count, unspecified: Secondary | ICD-10-CM

## 2015-06-02 DIAGNOSIS — Z87891 Personal history of nicotine dependence: Secondary | ICD-10-CM | POA: Insufficient documentation

## 2015-06-02 DIAGNOSIS — J029 Acute pharyngitis, unspecified: Secondary | ICD-10-CM | POA: Diagnosis present

## 2015-06-02 DIAGNOSIS — R221 Localized swelling, mass and lump, neck: Secondary | ICD-10-CM

## 2015-06-02 DIAGNOSIS — I10 Essential (primary) hypertension: Secondary | ICD-10-CM | POA: Diagnosis not present

## 2015-06-02 LAB — POCT CBC
GRANULOCYTE PERCENT: 80.2 % — AB (ref 37–80)
HEMATOCRIT: 39.2 % — AB (ref 43.5–53.7)
HEMOGLOBIN: 13.5 g/dL — AB (ref 14.1–18.1)
Lymph, poc: 2.7 (ref 0.6–3.4)
MCH: 27.8 pg (ref 27–31.2)
MCHC: 34.4 g/dL (ref 31.8–35.4)
MCV: 80.6 fL (ref 80–97)
MID (cbc): 0.6 (ref 0–0.9)
MPV: 7.2 fL (ref 0–99.8)
POC GRANULOCYTE: 13.2 — AB (ref 2–6.9)
POC LYMPH PERCENT: 16.2 %L (ref 10–50)
POC MID %: 3.6 %M (ref 0–12)
Platelet Count, POC: 318 10*3/uL (ref 142–424)
RBC: 4.86 M/uL (ref 4.69–6.13)
RDW, POC: 13.5 %
WBC: 16.4 10*3/uL — AB (ref 4.6–10.2)

## 2015-06-02 MED ORDER — DEXAMETHASONE SODIUM PHOSPHATE 10 MG/ML IJ SOLN
10.0000 mg | Freq: Once | INTRAMUSCULAR | Status: AC
  Administered 2015-06-02: 10 mg via INTRAVENOUS
  Filled 2015-06-02: qty 1

## 2015-06-02 MED ORDER — MORPHINE SULFATE (PF) 4 MG/ML IV SOLN
4.0000 mg | Freq: Once | INTRAVENOUS | Status: AC
  Administered 2015-06-02: 4 mg via INTRAVENOUS
  Filled 2015-06-02: qty 1

## 2015-06-02 MED ORDER — CEFTRIAXONE SODIUM 1 G IJ SOLR
1.0000 g | Freq: Once | INTRAMUSCULAR | Status: AC
  Administered 2015-06-02: 1 g via INTRAMUSCULAR

## 2015-06-02 NOTE — ED Notes (Signed)
Pt sent from UC for peritonsillar abscess.  Pt c/o painful swallowing x 3 days.  Had 100.6 temp by UC.  Sats 90% on ride in EMS and placed on 2L/[place] up to 98%.  Hasn't been able to take BP meds x last 3 days, EMS BP 177/115, HR 110 ST.  UC staff place #20 IV to RAC and placed 1gm of ceftriaxone into the 1liter NS fluid bag that is currently infusing.

## 2015-06-02 NOTE — Discharge Instructions (Signed)
Go to Dr. Theressa Millard office as soon as you leave the hospital [address], [place], [place] [address]  Peritonsillar Abscess A peritonsillar abscess is a collection of yellowish-white fluid (pus) in the back of the throat behind the tonsils. It usually occurs when an infection of the throat or tonsils (tonsillitis) spreads into the tissues around the tonsils. CAUSES The infection that leads to a peritonsillar abscess is usually caused by streptococcal bacteria.  SIGNS AND SYMPTOMS  Sore throat, often with pain on just one side.  Swelling and tenderness of the glands (lymph nodes) in the neck.  Difficulty swallowing.  Difficulty opening your mouth.  Fever.  Chills.  Drooling because of difficulty swallowing saliva.  Headache.  Changes in your voice.  Bad breath. DIAGNOSIS Your health care provider will take your medical history and do a physical exam. Imaging tests may be done, such as an ultrasound or CT scan. A sample of pus may be removed from the abscess using a needle (needle aspiration) or by swabbing the back of your throat. This sample will be sent to a lab for testing. TREATMENT Treatment usually involves draining the pus from the abscess. This may be done through needle aspiration or by making an incision in the abscess. You will also likely need to take antibiotic medicine. HOME CARE INSTRUCTIONS  Rest as much as possible and get plenty of sleep.  Take medicines only as directed by your health care provider.  If you were prescribed an antibiotic medicine, finish it all even if you start to feel better.  If your abscess was drained by your health care provider, gargle with a mixture of salt and warm water:  Mix 1 tsp of salt in 8 oz of warm water.  Gargle with this mixture four times per day or as needed for comfort.  Do not swallow this mixture.  Drink plenty of fluids.  While your throat is sore, eat soft or liquid foods,  such as frozen ice pops and ice cream.  Keep all follow-up visits as directed by your health care provider. This is important. SEEK MEDICAL CARE IF:  You have increased pain, swelling, redness, or drainage in your throat.  You develop a headache, a lack of energy (lethargy), or generalized feelings of illness.  You have a fever.  You feel dizzy.  You have difficulty swallowing or eating.  You show signs of becoming dehydrated, such as:  Light-headedness when standing.  Decreased urine output.  A fast heart rate.  Dry mouth. SEEK IMMEDIATE MEDICAL CARE IF:   You have difficulty talking or breathing, or you find it easier to breathe when you lean forward.  You are coughing up blood or vomiting blood.  You have severe throat pain that is not helped by medicines.  You start to drool.   This information is not intended to replace advice given to you by your health care provider. Make sure you discuss any questions you have with your health care provider.   Document Released: 01/29/2005 Document Revised: 02/19/2014 Document Reviewed: 09/14/2013 Elsevier Interactive Patient Education [employer].

## 2015-06-02 NOTE — ED Notes (Signed)
Pt told to go directly to ENT after discharge.

## 2015-06-02 NOTE — Progress Notes (Signed)
06/02/2015 10:27 AM   DOB: 1971/01/24 / MRN: [id number]  SUBJECTIVE:  Brent Cruz is a 45 y.o. male presenting for difficulty swallowing, painful swallowing and throat pain.  He is taking lisinopril for a history of HTN. He reports moderate to severe pain in his throat and reports some tenderness about the left side of his external throat.  Over the last 24 hours he has had difficulty swallowing oral secretions and reports that he has been spitting out the secretions rather than swallowing.    He has No Known Allergies.   He  has a past medical history of Bell's palsy; Obesity; Hypertension; and Myasthenia gravis with exacerbation ([place]) (03/30/2014).    He  reports that he quit smoking about 26 years ago. He has never used smokeless tobacco. He reports that he drinks alcohol. He reports that he does not use illicit drugs. He  has no sexual activity history on file. The patient  has no past surgical history on file.  His family history includes Diabetes in his paternal grandfather; Glaucoma in his father.  Review of Systems  Constitutional: Positive for fever and malaise/fatigue.  HENT: Positive for sore throat.   Cardiovascular: Negative for chest pain.  Gastrointestinal: Negative for nausea, vomiting and abdominal pain.  Skin: Negative for rash.  Neurological: Negative for dizziness.    Problem list and medications reviewed and updated by myself where necessary, and exist elsewhere in the encounter.   OBJECTIVE:  BP 132/90 mmHg  Pulse 123  Temp(Src) 100.9 F (38.3 C) (Oral)  Resp 20  Ht 5\' 6"  (1.676 m)  Wt 277 lb 3.2 oz (125.737 kg)  BMI 44.76 kg/m2  SpO2 96%  Physical Exam  Constitutional: He is oriented to person, place, and time. He appears well-developed. He appears ill. No distress.  HENT:  Head:    Mouth/Throat: Uvula is midline. Posterior oropharyngeal edema and posterior oropharyngeal erythema present.    Eyes: Conjunctivae and EOM are normal. Pupils are equal,  round, and reactive to light.  Neck:    Cardiovascular: Regular rhythm, normal heart sounds and intact distal pulses.   Pulmonary/Chest: Effort normal and breath sounds normal.  Abdominal: He exhibits no distension.  Musculoskeletal: Normal range of motion.  Neurological: He is alert and oriented to person, place, and time. No cranial nerve deficit. Coordination normal.  Skin: Skin is warm and dry. He is not diaphoretic.  Psychiatric: He has a normal mood and affect.  Nursing note and vitals reviewed.   Results for orders placed or performed in visit on 06/02/15 (from the past 72 hour(s))  POCT CBC     Status: Abnormal   Collection Time: 06/02/15 10:07 AM  Result Value Ref Range   WBC 16.4 (A) 4.6 - 10.2 K/uL   Lymph, poc 2.7 0.6 - 3.4   POC LYMPH PERCENT 16.2 10 - 50 %L   MID (cbc) 0.6 0 - 0.9   POC MID % 3.6 0 - 12 %M   POC Granulocyte 13.2 (A) 2 - 6.9   Granulocyte percent 80.2 (A) 37 - 80 %G   RBC 4.86 4.69 - 6.13 M/uL   Hemoglobin 13.5 (A) 14.1 - 18.1 g/dL   HCT, POC 39.2 (A) 43.5 - 53.7 %   MCV 80.6 80 - 97 fL   MCH, POC 27.8 27 - 31.2 pg   MCHC 34.4 31.8 - 35.4 g/dL   RDW, POC 13.5 %   Platelet Count, POC 318 142 - 424 K/uL   MPV 7.2  0 - 99.8 fL    No results found.  ASSESSMENT AND PLAN  [name] was seen today for throat swelling and shortness of breath.  Diagnoses and all orders for this visit:  Throat swelling: I am concerned for a peritonsillar abscess.  Will get him to [facility] ED via emergent transport for airway protection.   He will likely need imaging and ENT evaluation.  We have administered 1000 mg Ceftriaxone into his IV bag.  He is taking Lisinopril for BP control however has not been taking this for the last three days as he can not swallow and angioedema does not typically present in this manner.   -     cefTRIAXone (ROCEPHIN) injection 1 g; Inject 1 g into the IV.   -     POCT CBC  Tachycardia with 121 - 140 beats per minute: See problem  one. -     POCT CBC  Leukocytosis: See problem one.     The patient was advised to call or return to clinic if he does not see an improvement in symptoms or to seek the care of the closest emergency department if he worsens with the above plan.   Philis Fendt, MHS, PA-C [facility] 06/02/2015 10:27 AM

## 2015-06-02 NOTE — ED Provider Notes (Signed)
Patient seen with Brent Latino, PA-C.  Patient with sore throat.  Physical exam appears consistent with left sided peritonsillar abscess.  Tolerating secretions.  Intake VS show low grad temp of 100.9 and tachycardic.  He received a liter of fluid from [facility] and 1g of rocephin from [facility].  VSS are improving.  HR is 102 on my exam, temp 98.1.  Given 10 mg decadron.  Tolerating secretions.  No stridor.  Airway intact.    PE: HEENT: significant swelling of left oropharynx, appears consistent with abscess Lungs: CTAB, no stridor  Discussed with Dr. Oleta Mouse, who recommends consultation with Dr. Simeon Craft.  Discussed the patient Dr. Simeon Craft, informed him that I do not have imaging, but PE seems consistent with left-sided PTA.  Dr. Simeon Craft advises holding imaging and sending to his office directly.  Informed patient of plan, who understands and agrees.  Will go there now.  Montine Circle, PA-C 06/02/15 [place], MD 06/03/15 0630

## 2015-06-02 NOTE — Patient Instructions (Signed)
     IF you received an x-ray today, you will receive an invoice from [facility]. Please contact [facility] at [phone] with questions or concerns regarding your invoice.   IF you received labwork today, you will receive an invoice from [facility]. Please contact [facility] at [phone] with questions or concerns regarding your invoice.   Our billing staff will not be able to assist you with questions regarding bills from these companies.  You will be contacted with the lab results as soon as they are available. The fastest way to get your results is to activate your My Chart account. Instructions are located on the last page of this paperwork. If you have not heard from us regarding the results in 2 weeks, please contact this office.      

## 2015-06-02 NOTE — ED Provider Notes (Signed)
CSN: [id number]     Arrival date & time 06/02/15  1022 History   First MD Initiated Contact with Patient 06/02/15 1028     Chief Complaint  Patient presents with  . Sore Throat     (Consider location/radiation/quality/duration/timing/severity/associated sxs/prior Treatment) HPI Comments: Patient is a 45 y/o male with a history of HTN who presents to the ED from urgent care with sore throat and dysphagia for 4 days. He has had cold symptoms for roughly a week but woke Monday morning with a sore throat that has progressively gotten worse. He states pain is on the left, worse with swallowing. Associated fever and congestion. He denies chills, ear pain, eye discharge or pain, CP, SOB, N, V, D.   Patient is a 45 y.o. male presenting with pharyngitis. The history is provided by the patient.  Sore Throat Associated symptoms include a fever and a sore throat. Pertinent negatives include no abdominal pain, arthralgias, chest pain, chills, coughing, headaches, myalgias, nausea, rash, vomiting or weakness.    Past Medical History  Diagnosis Date  . Bell's palsy     Recurrent  . Obesity   . Hypertension   . Myasthenia gravis with exacerbation ([place]) 03/30/2014   No past surgical history on file. Family History  Problem Relation Age of Onset  . Glaucoma Father   . Diabetes Paternal Grandfather    Social History  Substance Use Topics  . Smoking status: Former Smoker    Quit date: 02/12/1989  . Smokeless tobacco: Never Used  . Alcohol Use: Yes     Comment: Consumes alcohol on occasion    Review of Systems  Constitutional: Positive for fever. Negative for chills.  HENT: Positive for sinus pressure, sore throat and trouble swallowing. Negative for ear discharge, ear pain and facial swelling.   Eyes: Negative for pain and discharge.  Respiratory: Negative for cough, chest tightness, shortness of breath and stridor.   Cardiovascular: Negative for chest pain and leg swelling.   Gastrointestinal: Negative for nausea, vomiting, abdominal pain and diarrhea.  Musculoskeletal: Negative for myalgias and arthralgias.  Skin: Negative for rash.  Neurological: Negative for dizziness, syncope, weakness and headaches.  All other systems reviewed and are negative.     Allergies  Review of patient's allergies indicates no known allergies.  Home Medications   Prior to Admission medications   Medication Sig Start Date End Date Taking? Authorizing Provider  aspirin-sod bicarb-citric acid (ALKA-SELTZER) 325 MG TBEF tablet Take 325 mg by mouth every 6 (six) hours as needed (for cold).   Yes Historical Provider, MD  GuaiFENesin (MUCINEX PO) Take 1 packet by mouth once.   Yes Historical Provider, MD  lisinopril-hydrochlorothiazide (PRINZIDE,ZESTORETIC) 10-12.5 MG tablet Take 1 tablet by mouth daily. 05/24/15  Yes Posey Boyer, MD  predniSONE (DELTASONE) 5 MG tablet Take 3 tablets (15 mg total) by mouth daily with breakfast. 03/30/14  Yes Kathrynn Ducking, MD  pyridostigmine (MESTINON) 60 MG tablet Take 0.5 tablets (30 mg total) by mouth 3 (three) times daily. 03/30/14  Yes Kathrynn Ducking, MD  rosuvastatin (CRESTOR) 5 MG tablet Take 1 tablet (5 mg total) by mouth daily. Patient not taking: Reported on 06/02/2015 05/27/15   Posey Boyer, MD   BP 155/92 mmHg  Pulse 122  Temp(Src) 98.1 F (36.7 C) (Oral)  Resp 15  SpO2 100% Physical Exam  Constitutional: He appears well-developed and well-nourished. No distress.  HENT:  Head: Normocephalic and atraumatic.  Right Ear: Tympanic membrane, external ear and ear  canal normal. No drainage.  Left Ear: Tympanic membrane, external ear and ear canal normal. No drainage.  Nose: Nose normal.  Mouth/Throat: Mucous membranes are normal. There is trismus in the jaw. Tonsillar abscesses present.  Patient is breathing through mouth due to sinus congestion, Tonsillar abscess present on the left side with uvula deviation to the right, no  exudate noted.  Eyes: Conjunctivae are normal.  Pulmonary/Chest: Effort normal.  Neurological: He is alert. Coordination normal.    ED Course  Procedures (including critical care time) Labs Review Labs Reviewed - No data to display  Imaging Review No results found. I have personally reviewed and evaluated these images and lab results as part of my medical decision-making.   EKG Interpretation None      MDM   Final diagnoses:  None    Febrile patient with peritonsillar abscess who receieved Ceftriaxone 1g and 1L of fluids at the Urgent care and 10mg  decadron while in the ED. Patient was discharged with instructions to go immediately to Dr. Theressa Millard office of [facility]. Lorre Munroe called and spoke to Dr. Simeon Craft who said to send the pt over to his office today.  He stated he did not take his BP meds today because of his difficulty swallowing and his BP was elevated while in the ED. Patient was tachycardic (122) at urgent care and upon arrival at the ED but his pulse at discharge was 105.   I discussed all of the results with the patient he expressed full understanding to the verbal discharge instructions.    Kalman Drape, PA 06/02/15 1646  Forde Dandy, MD 06/03/15 0630

## 2015-06-03 ENCOUNTER — Telehealth: Payer: Self-pay

## 2015-06-03 NOTE — Telephone Encounter (Signed)
Patient needs a letter faxed to his job stating that he was seen at our office and was sent out by ambulance. AttnArletha Grippe [phone]

## 2015-06-06 NOTE — Telephone Encounter (Signed)
Done and faxed

## 2015-10-15 ENCOUNTER — Other Ambulatory Visit: Payer: Self-pay | Admitting: Neurology

## 2015-10-18 ENCOUNTER — Other Ambulatory Visit: Payer: Self-pay | Admitting: Neurology

## 2015-10-18 ENCOUNTER — Telehealth: Payer: Self-pay | Admitting: Neurology

## 2015-10-18 MED ORDER — PREDNISONE 5 MG PO TABS: 15.0000 mg | ORAL_TABLET | Freq: Every day | ORAL | 0 refills | Status: DC

## 2015-10-18 NOTE — Telephone Encounter (Signed)
Attempted to reach pt via TC but no answer. Left VM mssg for pt to return call. Asked if he's taking medication currently (Prednisone or Mestinon). Also needs to schedule appt as he was last seen in February 2016. He no-showed his August 2016 appt.

## 2015-10-18 NOTE — Addendum Note (Signed)
Addended by: Monte Fantasia on: 10/18/2015 12:36 PM   Modules accepted: Orders

## 2015-10-18 NOTE — Telephone Encounter (Signed)
Late entry for an after hours call/message on 10/15/2015 at 2:52 PM. I tried calling back patient at the time and had to leave a message through the call service and patient did not call back. Message stated "experiencing lazy eye symptoms, wanted to know if he can call in Rx".

## 2015-11-09 ENCOUNTER — Telehealth: Payer: Self-pay | Admitting: Neurology

## 2015-11-09 MED ORDER — PYRIDOSTIGMINE BROMIDE 60 MG PO TABS: 60.0000 mg | ORAL_TABLET | Freq: Three times a day (TID) | ORAL | 0 refills | Status: DC

## 2015-11-09 MED ORDER — PREDNISONE 5 MG PO TABS: 15.0000 mg | ORAL_TABLET | Freq: Every day | ORAL | 0 refills | Status: DC

## 2015-11-09 NOTE — Telephone Encounter (Signed)
I will refill the medications. 

## 2015-11-09 NOTE — Telephone Encounter (Signed)
Patient requesting refill of predniSONE (DELTASONE) 5 MG tablet and pyridostigmine (MESTINON) 60 MG tablet called to [facility] on [address]. The patient has an appointment with Hoyle Sauer on 11-21-15.

## 2015-11-09 NOTE — Addendum Note (Signed)
Addended by: Margette Fast on: 11/09/2015 03:55 PM   Modules accepted: Orders

## 2015-11-21 ENCOUNTER — Ambulatory Visit (INDEPENDENT_AMBULATORY_CARE_PROVIDER_SITE_OTHER): Payer: BLUE CROSS/BLUE SHIELD | Admitting: Nurse Practitioner

## 2015-11-21 ENCOUNTER — Encounter: Payer: Self-pay | Admitting: Nurse Practitioner

## 2015-11-21 VITALS — BP 175/118 | HR 88 | Ht 66.0 in | Wt 288.2 lb

## 2015-11-21 DIAGNOSIS — Z5181 Encounter for therapeutic drug level monitoring: Secondary | ICD-10-CM

## 2015-11-21 DIAGNOSIS — G7001 Myasthenia gravis with (acute) exacerbation: Secondary | ICD-10-CM

## 2015-11-21 DIAGNOSIS — G510 Bell's palsy: Secondary | ICD-10-CM

## 2015-11-21 MED ORDER — PREDNISONE 5 MG PO TABS: 15.0000 mg | ORAL_TABLET | Freq: Every day | ORAL | 4 refills | Status: DC

## 2015-11-21 MED ORDER — PYRIDOSTIGMINE BROMIDE 60 MG PO TABS: 60.0000 mg | ORAL_TABLET | Freq: Four times a day (QID) | ORAL | 4 refills | Status: DC

## 2015-11-21 NOTE — Patient Instructions (Signed)
Increase Mestinon to 4 times daily Continue prednisone at 15 mg daily Will check labs today Follow-up in 3 months

## 2015-11-21 NOTE — Progress Notes (Signed)
[facility]  PATIENT: Brent Cruz DOB: [birth year]   REASON FOR VISIT: Follow-up for myasthenia gravis  HISTORY FROM: Patient    HISTORY OF PRESENT ILLNESS:UPDATE 10/09/2017CM Brent Cruz, 45 year old male returns for follow-up. He has history of myasthenia gravis and also left-sided Bell's palsy when last seen by Dr. Jannifer Franklin. He had been off of his Mestinon and prednisone for about a year and a half. He was placed back on Mestinon 3 times a day with prednisone 20 mg daily. He is now taking prednisone 15 mg daily. He continues to have ptosis of the left eye however he denies any overt double vision. He has not had weakness of the extremities he has not had any problems with swallowing or chewing. He feels he is no better or worse than when last seen he returns for reevaluation.    03/31/15 KWMr. Cruz is a 45 year old right-handed white male with a history of myasthenia gravis. He has had a left-sided Bell's palsy, but he developed recurring weakness and was seen in January 2014. The patient was found to have elevated acetylcholine receptor antibody levels, and a CT scan of the chest was unremarkable. The patient placed on prednisone and Mestinon, but he was lost to follow-up. The patient apparently has not been on any medication for least a year or year and a half. He has done quite well until about 2 weeks ago when he had recurrence of left facial weakness. He has had ptosis of the left eye that may close that eye. The patient denies any overt double vision. He denies any weakness of the extremities. He has not had any weakness with chewing, or change in speech or swallowing. He returns for an evaluation. He is back on prednisone, currently on 20 mg daily. The prednisone has improved his symptoms.   REVIEW OF SYSTEMS: Full 14 system review of systems performed and notable only for those listed, all others are neg:  Constitutional: neg  Cardiovascular: neg Ear/Nose/Throat: neg    Skin: neg Eyes: Blurred vision, left ptosis Respiratory: neg Gastroitestinal: neg  Hematology/Lymphatic: neg  Endocrine: neg Musculoskeletal:neg Allergy/Immunology: neg Neurological: neg Psychiatric: neg Sleep : neg   ALLERGIES: No Known Allergies  HOME MEDICATIONS: Outpatient Medications Prior to Visit  Medication Sig Dispense Refill  . lisinopril-hydrochlorothiazide (PRINZIDE,ZESTORETIC) 10-12.5 MG tablet Take 1 tablet by mouth daily. 90 tablet 3  . predniSONE (DELTASONE) 5 MG tablet Take 3 tablets (15 mg total) by mouth daily with breakfast. 90 tablet 0  . pyridostigmine (MESTINON) 60 MG tablet Take 1 tablet (60 mg total) by mouth 3 (three) times daily. 90 tablet 0  . aspirin-sod bicarb-citric acid (ALKA-SELTZER) 325 MG TBEF tablet Take 325 mg by mouth every 6 (six) hours as needed (for cold).    . GuaiFENesin (MUCINEX PO) Take 1 packet by mouth once.    . rosuvastatin (CRESTOR) 5 MG tablet Take 1 tablet (5 mg total) by mouth daily. (Patient not taking: Reported on 11/21/2015) 30 tablet 3   No facility-administered medications prior to visit.     PAST MEDICAL HISTORY: Past Medical History:  Diagnosis Date  . Bell's palsy    Recurrent  . Hypertension   . Myasthenia gravis with exacerbation ([place]) 03/30/2014  . Obesity     PAST SURGICAL HISTORY: History reviewed. No pertinent surgical history.  FAMILY HISTORY: Family History  Problem Relation Age of Onset  . Glaucoma Father   . Diabetes Paternal Grandfather     SOCIAL HISTORY: Social History  Social History  . Marital status: Single    Spouse name: N/A  . Number of children: 2  . Years of education: College   Occupational History  . Driver [employer]    Social History Main Topics  . Smoking status: Former Smoker    Quit date: 02/12/1989  . Smokeless tobacco: Never Used  . Alcohol use Yes     Comment: Consumes alcohol on occasion  . Drug use: No  . Sexual activity: Not on file   Other Topics Concern  .  Not on file   Social History Narrative   Patient is right handed.   Patient does not drink caffeine.     PHYSICAL EXAM  Vitals:   11/21/15 1047  BP: (!) 175/118  Pulse: 88  Weight: 288 lb 3.2 oz (130.7 kg)  Height: 5\' 6"  (1.676 m)   Body mass index is 46.52 kg/m. General: The patient is alert and cooperative at the time of the examination. The patient is moderately obese. Skin: No significant peripheral edema is noted. Neurologic Exam Mental status: The patient is oriented x 3.He follows all commands  Cranial nerves: Facial symmetry is not present. There is facial asymmetry, mild left facial weakness. Speech is normal, no aphasia or dysarthria is noted. Extraocular movements are full. Visual fields are full. With superior gaze for 1 minute, the left eye completely closes after 35 seconds. No subjective double vision was noted. Motor: The patient has good strength in all 4 extremities. With arms outstretched 1 minute, no fatigable weakness of the deltoid muscles was noted. Sensory examination: Soft touch sensation on the face, arms, and legs was symmetric. Coordination: The patient has good finger-nose-finger and heel-to-shin bilaterally. Gait and station: The patient has a normal gait. Tandem gait is normal. Romberg is negative. No drift is seen. Reflexes: Deep tendon reflexes are symmetric.  DIAGNOSTIC DATA (LABS, IMAGING, TESTING) - I reviewed patient records, labs, notes, testing and imaging myself where available.  Lab Results  Component Value Date   WBC 16.4 (A) 06/02/2015   HGB 13.5 (A) 06/02/2015   HCT 39.2 (A) 06/02/2015   MCV 80.6 06/02/2015   PLT 314 11/18/2010      Component Value Date/Time   NA 143 05/24/2015 1828   K 4.2 05/24/2015 1828   CL 104 05/24/2015 1828   CO2 32 (H) 05/24/2015 1828   GLUCOSE 100 (H) 05/24/2015 1828   BUN 9 05/24/2015 1828   CREATININE 0.78 05/24/2015 1828   CALCIUM 9.3 05/24/2015 1828   PROT 7.0 05/24/2015 1828    ALBUMIN 3.9 05/24/2015 1828   AST 12 05/24/2015 1828   ALT 14 05/24/2015 1828   ALKPHOS 50 05/24/2015 1828   BILITOT 0.4 05/24/2015 1828   GFRNONAA >90 11/18/2010 0313   GFRAA >90 11/18/2010 0313   Lab Results  Component Value Date   CHOL 248 (H) 05/24/2015   HDL 34 (L) 05/24/2015   LDLCALC 168 (H) 05/24/2015   TRIG 228 (H) 05/24/2015   CHOLHDL 7.3 (H) 05/24/2015   Lab Results  Component Value Date   HGBA1C 5.9 05/24/2015      ASSESSMENT AND PLAN  45 y.o. year old male  has a past medical history of Bell's palsy; Hypertension; Myasthenia gravis with exacerbation ([place]) (03/30/2014); and Obesity. hereTo follow-up for his myasthenia gravis The patient has had minimal features with myasthenia, mainly associated with ptosis involving the left eye. With superior gaze for 1 minute, the left eye completely closes after 35 seconds.  PLAN: Increase Mestinon to 4 times daily Continue prednisone at 15 mg daily Given some information on myasthenia gravis and when to call us Will check labs today Blood pressure elevated today he has not gotten his medication renewed and was encouraged to do so Follow-up in 3 months Dennie Bible, [facility], [facility], [facility] [address], [place], [place] [address] [phone]

## 2015-11-21 NOTE — Progress Notes (Signed)
I have read the note, and I agree with the clinical assessment and plan.  Angela Platner KEITH   

## 2015-11-22 ENCOUNTER — Telehealth: Payer: Self-pay | Admitting: *Deleted

## 2015-11-22 LAB — COMPREHENSIVE METABOLIC PANEL
A/G RATIO: 1.2 (ref 1.2–2.2)
ALT: 13 IU/L (ref 0–44)
AST: 12 IU/L (ref 0–40)
Albumin: 4.1 g/dL (ref 3.5–5.5)
Alkaline Phosphatase: 63 IU/L (ref 39–117)
BILIRUBIN TOTAL: 0.2 mg/dL (ref 0.0–1.2)
BUN/Creatinine Ratio: 8 — ABNORMAL LOW (ref 9–20)
BUN: 7 mg/dL (ref 6–24)
CHLORIDE: 101 mmol/L (ref 96–106)
CO2: 27 mmol/L (ref 18–29)
Calcium: 9.8 mg/dL (ref 8.7–10.2)
Creatinine, Ser: 0.83 mg/dL (ref 0.76–1.27)
GFR calc non Af Amer: 106 mL/min/{1.73_m2} (ref >59)
GFR, EST AFRICAN AMERICAN: 122 mL/min/{1.73_m2} (ref >59)
GLUCOSE: 92 mg/dL (ref 65–99)
Globulin, Total: 3.4 g/dL (ref 1.5–4.5)
POTASSIUM: 5 mmol/L (ref 3.5–5.2)
Sodium: 143 mmol/L (ref 134–144)
TOTAL PROTEIN: 7.5 g/dL (ref 6.0–8.5)

## 2015-11-22 LAB — CBC WITH DIFFERENTIAL/PLATELET
BASOS ABS: 0.1 10*3/uL (ref 0.0–0.2)
Basos: 1 %
EOS (ABSOLUTE): 0.1 10*3/uL (ref 0.0–0.4)
Eos: 2 %
HEMOGLOBIN: 14.1 g/dL (ref 12.6–17.7)
Hematocrit: 44.7 % (ref 37.5–51.0)
IMMATURE GRANS (ABS): 0 10*3/uL (ref 0.0–0.1)
IMMATURE GRANULOCYTES: 1 %
LYMPHS: 29 %
Lymphocytes Absolute: 1.6 10*3/uL (ref 0.7–3.1)
MCH: 27 pg (ref 26.6–33.0)
MCHC: 31.5 g/dL (ref 31.5–35.7)
MCV: 86 fL (ref 79–97)
MONOCYTES: 9 %
Monocytes Absolute: 0.5 10*3/uL (ref 0.1–0.9)
NEUTROS PCT: 58 %
Neutrophils Absolute: 3.4 10*3/uL (ref 1.4–7.0)
PLATELETS: 352 10*3/uL (ref 150–379)
RBC: 5.23 x10E6/uL (ref 4.14–5.80)
RDW: 14.1 % (ref 12.3–15.4)
WBC: 5.7 10*3/uL (ref 3.4–10.8)

## 2015-11-22 NOTE — Telephone Encounter (Signed)
LMVM home # (ok per DPR) that labs drawn in office had good results.  He is to call back if questions.

## 2015-11-22 NOTE — Telephone Encounter (Signed)
-----   Message from Dennie Bible, NP sent at 11/22/2015  7:53 AM EDT ----- Labs good please call the patient

## 2016-03-06 ENCOUNTER — Ambulatory Visit: Payer: Self-pay | Admitting: Nurse Practitioner

## 2016-04-19 ENCOUNTER — Ambulatory Visit: Payer: Self-pay | Admitting: Nurse Practitioner

## 2016-08-09 ENCOUNTER — Other Ambulatory Visit: Payer: Self-pay | Admitting: Family Medicine

## 2016-08-09 DIAGNOSIS — I10 Essential (primary) hypertension: Secondary | ICD-10-CM

## 2016-08-16 ENCOUNTER — Other Ambulatory Visit: Payer: Self-pay | Admitting: Family Medicine

## 2016-08-16 DIAGNOSIS — I10 Essential (primary) hypertension: Secondary | ICD-10-CM

## 2016-08-17 NOTE — Telephone Encounter (Signed)
Please call patient. Rx authorized. Needs to schedule follow-up and establish with new PCP since Dr. Linna Darner has retired. Has seen Philis Fendt, PA-C previously x 1.  Meds ordered this encounter  Medications  . lisinopril-hydrochlorothiazide (PRINZIDE,ZESTORETIC) 10-12.5 MG tablet    Sig: TAKE ONE TABLET BY MOUTH ONCE DAILY    Dispense:  90 tablet    Refill:  0    Please notify patient that s/he needs an office visit +/- labsfor additional refills.

## 2016-08-23 ENCOUNTER — Other Ambulatory Visit: Payer: Self-pay | Admitting: Family Medicine

## 2016-08-23 DIAGNOSIS — I10 Essential (primary) hypertension: Secondary | ICD-10-CM

## 2016-08-25 NOTE — Telephone Encounter (Signed)
Please call patient. Rx authorized. Needs to schedule follow-up and establish with new PCP since Dr. Linna Darner has retired.  Meds ordered this encounter  Medications  . lisinopril-hydrochlorothiazide (PRINZIDE,ZESTORETIC) 10-12.5 MG tablet    Sig: TAKE ONE TABLET BY MOUTH ONCE DAILY    Dispense:  30 tablet    Refill:  0    Please notify patient that s/he needs an office visit +/- labsfor additional refills.

## 2016-08-27 NOTE — Telephone Encounter (Signed)
SPOKE WITH PT HE STATES THAT HE WILL CALL BY THE END OF THE MONTH TO SET UP AN OV AND A PAYMENT PLAN SO HE CAN BE SEEN HERE

## 2016-12-11 ENCOUNTER — Other Ambulatory Visit: Payer: Self-pay | Admitting: Nurse Practitioner

## 2017-06-10 ENCOUNTER — Ambulatory Visit: Payer: BLUE CROSS/BLUE SHIELD | Admitting: Physician Assistant

## 2017-06-10 ENCOUNTER — Other Ambulatory Visit: Payer: Self-pay

## 2017-06-10 ENCOUNTER — Encounter: Payer: Self-pay | Admitting: Physician Assistant

## 2017-06-10 VITALS — BP 148/94 | HR 85 | Temp 98.4°F | Resp 16 | Ht 66.0 in | Wt 306.6 lb

## 2017-06-10 DIAGNOSIS — Z9189 Other specified personal risk factors, not elsewhere classified: Secondary | ICD-10-CM

## 2017-06-10 DIAGNOSIS — E785 Hyperlipidemia, unspecified: Secondary | ICD-10-CM

## 2017-06-10 DIAGNOSIS — I10 Essential (primary) hypertension: Secondary | ICD-10-CM | POA: Diagnosis not present

## 2017-06-10 MED ORDER — ASPIRIN 81 MG PO CHEW: 81.0000 mg | CHEWABLE_TABLET | Freq: Every day | ORAL | 3 refills | Status: DC

## 2017-06-10 MED ORDER — ROSUVASTATIN CALCIUM 5 MG PO TABS: 5.0000 mg | ORAL_TABLET | Freq: Every day | ORAL | 3 refills | Status: DC

## 2017-06-10 MED ORDER — LISINOPRIL-HYDROCHLOROTHIAZIDE 10-12.5 MG PO TABS: 1.0000 | ORAL_TABLET | Freq: Every day | ORAL | 3 refills | Status: DC

## 2017-06-10 NOTE — Patient Instructions (Addendum)
  Please lose 15 lbs by our three month appointment. Consider weight watchers.   IF you received an x-ray today, you will receive an invoice from [facility]. Please contact [facility] at [phone] with questions or concerns regarding your invoice.   IF you received labwork today, you will receive an invoice from [facility]. Please contact [facility] at [phone] with questions or concerns regarding your invoice.   Our billing staff will not be able to assist you with questions regarding bills from these companies.  You will be contacted with the lab results as soon as they are available. The fastest way to get your results is to activate your My Chart account. Instructions are located on the last page of this paperwork. If you have not heard from [place] regarding the results in 2 weeks, please contact this office.

## 2017-06-10 NOTE — Progress Notes (Deleted)
Lab Results  Component Value Date   CHOL 248 (H) 05/24/2015   HDL 34 (L) 05/24/2015   LDLCALC 168 (H) 05/24/2015   TRIG 228 (H) 05/24/2015   CHOLHDL 7.3 (H) 05/24/2015   Lab Results  Component Value Date   HGBA1C 5.9 05/24/2015   Lab Results  Component Value Date   CREATININE 0.83 11/21/2015   BUN 7 11/21/2015   NA 143 11/21/2015   K 5.0 11/21/2015   CL 101 11/21/2015   CO2 27 11/21/2015   Lab Results  Component Value Date   ALT 13 11/21/2015   AST 12 11/21/2015   ALKPHOS 63 11/21/2015   BILITOT 0.2 11/21/2015    The 10-year ASCVD risk score Mikey Bussing DC Jr., et al., 2013) is: 12.3%   Values used to calculate the score:     Age: 47 years     Sex: Male     Is Non-Hispanic African American: Yes     Diabetic: No     Tobacco smoker: No     Systolic Blood Pressure: 038 mmHg     Is BP treated: Yes     HDL Cholesterol: 34 mg/dL     Total Cholesterol: 248 mg/dL

## 2017-06-10 NOTE — Progress Notes (Signed)
06/14/2017 10:07 AM   DOB: [birth year] / MRN: [id number]  SUBJECTIVE:  Brent Cruz is a 47 y.o. male presenting for medication refills.  He takes medication for HTN and dyslipidemia.  He feels well today and denies complaint.  He does not take ASA. Has a history of myasthenia gravis and is lost to neurology follow up. Tells me he will call them and get back in.   He has No Known Allergies.   He  has a past medical history of Bell's palsy, Hypertension, Myasthenia gravis with exacerbation ([place]) (03/30/2014), and Obesity.    He  reports that he quit smoking about 28 years ago. He has never used smokeless tobacco. He reports that he drinks alcohol. He reports that he does not use drugs. He  has no sexual activity history on file. The patient  has a past surgical history that includes tonsil abscess.  His family history includes Diabetes in his paternal grandfather; Glaucoma in his father.  Review of Systems  Constitutional: Negative for chills, diaphoresis and fever.  Eyes: Negative.   Respiratory: Negative for cough, hemoptysis, sputum production, shortness of breath and wheezing.   Cardiovascular: Negative for chest pain.  Gastrointestinal: Negative for abdominal pain, blood in stool, constipation, diarrhea, heartburn, melena, nausea and vomiting.  Genitourinary: Negative for dysuria, flank pain, frequency, hematuria and urgency.  Skin: Negative for rash.  Neurological: Negative for dizziness, sensory change, speech change, focal weakness and headaches.    The problem list and medications were reviewed and updated by myself where necessary and exist elsewhere in the encounter.   OBJECTIVE:  BP (!) 148/94   Pulse 85   Temp 98.4 F (36.9 C) (Oral)   Resp 16   Ht 5\' 6"  (1.676 m)   Wt (!) 306 lb 9.6 oz (139.1 kg)   SpO2 97%   BMI 49.49 kg/m   Physical Exam  Constitutional: He is oriented to person, place, and time. He appears well-developed. He does not appear ill.  Eyes: Pupils  are equal, round, and reactive to light. Conjunctivae and EOM are normal.  Cardiovascular: Normal rate.  Pulmonary/Chest: Effort normal.  Abdominal: He exhibits no distension.  Musculoskeletal: Normal range of motion.  Neurological: He is alert and oriented to person, place, and time. No cranial nerve deficit. Coordination normal.  Skin: Skin is warm and dry. He is not diaphoretic.  Psychiatric: He has a normal mood and affect.  Nursing note and vitals reviewed.   No results found for this or any previous visit (from the past 72 hour(s)).  No results found.  ASSESSMENT AND PLAN:  Devontaye was seen today for hypertension and left eye.  Diagnoses and all orders for this visit:  Hypertension, unspecified type -     Renal Function Panel -     Hemoglobin A1c  Essential hypertension -     lisinopril-hydrochlorothiazide (PRINZIDE,ZESTORETIC) 10-12.5 MG tablet; Take 1 tablet by mouth daily.  Hyperlipidemia -     rosuvastatin (CRESTOR) 5 MG tablet; Take 1 tablet (5 mg total) by mouth daily at 6 PM.  At risk for acute ischemic cardiac event -     aspirin (ASPIRIN CHILDRENS) 81 MG chewable tablet; Chew 1 tablet (81 mg total) by mouth daily.    The patient is advised to call or return to clinic if he does not see an improvement in symptoms, or to seek the care of the closest emergency department if he worsens with the above plan.   Philis Fendt, MHS, PA-C  [facility] [place] 06/14/2017 10:07 AM

## 2017-06-11 LAB — HEMOGLOBIN A1C
ESTIMATED AVERAGE GLUCOSE: 134 mg/dL
HEMOGLOBIN A1C: 6.3 % — AB (ref 4.8–5.6)

## 2017-06-11 LAB — RENAL FUNCTION PANEL
ALBUMIN: 4.1 g/dL (ref 3.5–5.5)
BUN/Creatinine Ratio: 12 (ref 9–20)
BUN: 10 mg/dL (ref 6–24)
CALCIUM: 9.2 mg/dL (ref 8.7–10.2)
CO2: 29 mmol/L (ref 20–29)
CREATININE: 0.86 mg/dL (ref 0.76–1.27)
Chloride: 101 mmol/L (ref 96–106)
GFR calc Af Amer: 119 mL/min/{1.73_m2} (ref >59)
GFR, EST NON AFRICAN AMERICAN: 103 mL/min/{1.73_m2} (ref >59)
Glucose: 83 mg/dL (ref 65–99)
PHOSPHORUS: 3.1 mg/dL (ref 2.5–4.5)
Potassium: 4.2 mmol/L (ref 3.5–5.2)
Sodium: 143 mmol/L (ref 134–144)

## 2017-07-04 ENCOUNTER — Ambulatory Visit: Payer: Self-pay | Admitting: Physician Assistant

## 2017-09-09 ENCOUNTER — Ambulatory Visit: Payer: BLUE CROSS/BLUE SHIELD | Admitting: Physician Assistant

## 2017-09-23 ENCOUNTER — Ambulatory Visit: Payer: Self-pay | Admitting: Physician Assistant

## 2017-09-27 ENCOUNTER — Encounter: Payer: Self-pay | Admitting: Physician Assistant

## 2017-09-27 ENCOUNTER — Other Ambulatory Visit: Payer: Self-pay

## 2017-09-27 ENCOUNTER — Ambulatory Visit: Payer: BLUE CROSS/BLUE SHIELD | Admitting: Physician Assistant

## 2017-09-27 VITALS — BP 124/82 | HR 102 | Temp 98.7°F | Resp 16 | Ht 66.0 in | Wt 307.0 lb

## 2017-09-27 DIAGNOSIS — I10 Essential (primary) hypertension: Secondary | ICD-10-CM | POA: Diagnosis not present

## 2017-09-27 DIAGNOSIS — R7303 Prediabetes: Secondary | ICD-10-CM | POA: Diagnosis not present

## 2017-09-27 DIAGNOSIS — E785 Hyperlipidemia, unspecified: Secondary | ICD-10-CM | POA: Diagnosis not present

## 2017-09-27 LAB — POCT GLYCOSYLATED HEMOGLOBIN (HGB A1C): HEMOGLOBIN A1C: 6.3 % — AB (ref 4.0–5.6)

## 2017-09-27 NOTE — Patient Instructions (Addendum)
Come back and see Dr. Mitchel Honour in about 6 months.     If you have lab work done today you will be contacted with your lab results within the next 2 weeks.  If you have not heard from [place] then please contact us. The fastest way to get your results is to register for My Chart.   IF you received an x-ray today, you will receive an invoice from [facility]. Please contact [facility] at [phone] with questions or concerns regarding your invoice.   IF you received labwork today, you will receive an invoice from [facility]. Please contact [facility] at [phone] with questions or concerns regarding your invoice.   Our billing staff will not be able to assist you with questions regarding bills from these companies.  You will be contacted with the lab results as soon as they are available. The fastest way to get your results is to activate your My Chart account. Instructions are located on the last page of this paperwork. If you have not heard from [place] regarding the results in 2 weeks, please contact this office.

## 2017-09-27 NOTE — Progress Notes (Signed)
09/30/2017 10:00 AM   DOB: 1970/11/19 / MRN: [id number]  SUBJECTIVE:  Brent Cruz is a 47 y.o. male presenting for recheck htn and dyslipidemia.  He feels well today and is taking medication daily without missing doses. No CP, SOB, DOE, orthopnea, leg swelling. He has been working out in the heat today.   He has No Known Allergies.   He  has a past medical history of Bell's palsy, Hypertension, Myasthenia gravis with exacerbation ([place]) (03/30/2014), and Obesity.    He  reports that he quit smoking about 28 years ago. He has never used smokeless tobacco. He reports that he drinks alcohol. He reports that he does not use drugs. He  has no sexual activity history on file. The patient  has a past surgical history that includes tonsil abscess.  His family history includes Diabetes in his paternal grandfather; Glaucoma in his father.  Review of Systems  Constitutional: Negative for chills, diaphoresis and fever.  Eyes: Negative.   Respiratory: Negative for cough, hemoptysis, sputum production, shortness of breath and wheezing.   Cardiovascular: Negative for chest pain, orthopnea and leg swelling.  Gastrointestinal: Negative for abdominal pain, blood in stool, constipation, diarrhea, heartburn, melena, nausea and vomiting.  Genitourinary: Negative for flank pain.  Skin: Negative for rash.  Neurological: Negative for dizziness, sensory change, speech change, focal weakness and headaches.    The problem list and medications were reviewed and updated by myself where necessary and exist elsewhere in the encounter.   OBJECTIVE:  BP 124/82   Pulse (!) 102   Temp 98.7 F (37.1 C)   Resp 16   Ht 5\' 6"  (1.676 m)   Wt (!) 307 lb (139.3 kg)   SpO2 97%   BMI 49.55 kg/m   Wt Readings from Last 3 Encounters:  09/27/17 (!) 307 lb (139.3 kg)  06/10/17 (!) 306 lb 9.6 oz (139.1 kg)  11/21/15 288 lb 3.2 oz (130.7 kg)   Temp Readings from Last 3 Encounters:  09/27/17 98.7 F (37.1 C)  06/10/17  98.4 F (36.9 C) (Oral)  06/02/15 98.1 F (36.7 C) (Oral)   BP Readings from Last 3 Encounters:  09/27/17 124/82  06/10/17 (!) 148/94  11/21/15 (!) 175/118   Pulse Readings from Last 3 Encounters:  09/30/17 (!) 102  06/10/17 85  11/21/15 88    Physical Exam  Constitutional: He is oriented to person, place, and time. He appears well-developed. He does not appear ill.  Eyes: Pupils are equal, round, and reactive to light. Conjunctivae and EOM are normal.  Cardiovascular: Normal rate, regular rhythm, S1 normal, S2 normal, normal heart sounds, intact distal pulses and normal pulses. Exam reveals no gallop and no friction rub.  No murmur heard. Pulmonary/Chest: Effort normal. No stridor. No respiratory distress. He has no wheezes. He has no rales.  Abdominal: He exhibits no distension.  Musculoskeletal: Normal range of motion. He exhibits no edema.  Neurological: He is alert and oriented to person, place, and time. No cranial nerve deficit. Coordination normal.  Skin: Skin is warm and dry. He is not diaphoretic.  Psychiatric: He has a normal mood and affect.  Nursing note and vitals reviewed.   Lab Results  Component Value Date   HGBA1C 6.3 (A) 09/27/2017    Lab Results  Component Value Date   WBC 5.7 11/21/2015   HGB 14.1 11/21/2015   HCT 44.7 11/21/2015   MCV 86 11/21/2015   PLT 352 11/21/2015    Lab Results  Component  Value Date   CREATININE 0.99 09/27/2017   BUN 12 09/27/2017   NA 141 09/27/2017   K 4.0 09/27/2017   CL 98 09/27/2017   CO2 26 09/27/2017    Lab Results  Component Value Date   ALT 13 11/21/2015   AST 12 11/21/2015   ALKPHOS 63 11/21/2015   BILITOT 0.2 11/21/2015    Lab Results  Component Value Date   TSH 0.922 02/11/2014    Lab Results  Component Value Date   CHOL 206 (H) 09/27/2017   HDL 38 (L) 09/27/2017   LDLCALC 148 (H) 09/27/2017   TRIG 102 09/27/2017   CHOLHDL 5.4 (H) 09/27/2017   Lab Results  Component Value Date   CHOL  206 (H) 09/27/2017   CHOL 248 (H) 05/24/2015   CHOL 309 (H) 02/11/2014   Lab Results  Component Value Date   HDL 38 (L) 09/27/2017   HDL 34 (L) 05/24/2015   HDL 39 (L) 02/11/2014   Lab Results  Component Value Date   LDLCALC 148 (H) 09/27/2017   LDLCALC 168 (H) 05/24/2015   LDLCALC 240 (H) 02/11/2014   Lab Results  Component Value Date   TRIG 102 09/27/2017   TRIG 228 (H) 05/24/2015   TRIG 152 (H) 02/11/2014   Lab Results  Component Value Date   CHOLHDL 5.4 (H) 09/27/2017   CHOLHDL 7.3 (H) 05/24/2015   CHOLHDL 7.9 02/11/2014   No results found for: LDLDIRECT   ASSESSMENT AND PLAN:  [name] was seen today for hypertension.  Diagnoses and all orders for this visit:  Well-controlled hypertension -     Renal Function Panel  Dyslipidemia -     Lipid Panel  Prediabetes -     POCT glycosylated hemoglobin (Hb A1C)    The patient is advised to call or return to clinic if he does not see an improvement in symptoms, or to seek the care of the closest emergency department if he worsens with the above plan.   Philis Fendt, MHS, PA-C [facility] 09/30/2017 10:00 AM

## 2017-09-28 LAB — LIPID PANEL
CHOL/HDL RATIO: 5.4 ratio — AB (ref 0.0–5.0)
CHOLESTEROL TOTAL: 206 mg/dL — AB (ref 100–199)
HDL: 38 mg/dL — ABNORMAL LOW (ref >39)
LDL Calculated: 148 mg/dL — ABNORMAL HIGH (ref 0–99)
TRIGLYCERIDES: 102 mg/dL (ref 0–149)
VLDL Cholesterol Cal: 20 mg/dL (ref 5–40)

## 2017-09-28 LAB — RENAL FUNCTION PANEL
Albumin: 4.3 g/dL (ref 3.5–5.5)
BUN / CREAT RATIO: 12 (ref 9–20)
BUN: 12 mg/dL (ref 6–24)
CO2: 26 mmol/L (ref 20–29)
Calcium: 9.9 mg/dL (ref 8.7–10.2)
Chloride: 98 mmol/L (ref 96–106)
Creatinine, Ser: 0.99 mg/dL (ref 0.76–1.27)
GFR calc Af Amer: 104 mL/min/{1.73_m2} (ref >59)
GFR, EST NON AFRICAN AMERICAN: 90 mL/min/{1.73_m2} (ref >59)
GLUCOSE: 120 mg/dL — AB (ref 65–99)
PHOSPHORUS: 2.6 mg/dL (ref 2.5–4.5)
Potassium: 4 mmol/L (ref 3.5–5.2)
SODIUM: 141 mmol/L (ref 134–144)

## 2017-10-08 ENCOUNTER — Telehealth: Payer: Self-pay | Admitting: Emergency Medicine

## 2017-10-08 NOTE — Telephone Encounter (Signed)
Called patient in regards to his appt he has with Dr. Mitchel Honour on 04/04/2018. The provider will be out of the office on that day and needs to be rescheduled. Did not leave VM because no dpr on file.

## 2018-04-04 ENCOUNTER — Ambulatory Visit: Payer: BLUE CROSS/BLUE SHIELD | Admitting: Emergency Medicine

## 2018-06-17 ENCOUNTER — Telehealth: Payer: Self-pay | Admitting: Neurology

## 2018-06-17 NOTE — Telephone Encounter (Signed)
I contacted the pt and completed pre charting for 06/18/18 visit.

## 2018-06-17 NOTE — Telephone Encounter (Signed)
Pt called in to set up follow up appt , pt gives consent for doxy.me visit   [phone]  [employer]

## 2018-06-17 NOTE — Telephone Encounter (Signed)
Link has been sent.

## 2018-06-18 ENCOUNTER — Other Ambulatory Visit: Payer: Self-pay

## 2018-06-18 ENCOUNTER — Ambulatory Visit (INDEPENDENT_AMBULATORY_CARE_PROVIDER_SITE_OTHER): Payer: Self-pay | Admitting: Neurology

## 2018-06-18 ENCOUNTER — Encounter: Payer: Self-pay | Admitting: Neurology

## 2018-06-18 DIAGNOSIS — G7001 Myasthenia gravis with (acute) exacerbation: Secondary | ICD-10-CM

## 2018-06-18 MED ORDER — PYRIDOSTIGMINE BROMIDE 60 MG PO TABS: 30.0000 mg | ORAL_TABLET | Freq: Four times a day (QID) | ORAL | 3 refills | Status: DC

## 2018-06-18 MED ORDER — PREDNISONE 10 MG PO TABS: 10.0000 mg | ORAL_TABLET | Freq: Every day | ORAL | 1 refills | Status: DC

## 2018-06-18 NOTE — Progress Notes (Signed)
    Virtual Visit via Video Note  I connected with Brent Cruz on 06/18/18 at  3:30 PM EDT by a video enabled telemedicine application and verified that I am speaking with the correct person using two identifiers.  Location: Patient: The patient is at home. Provider: The physician in office.   I discussed the limitations of evaluation and management by telemedicine and the availability of in person appointments. The patient expressed understanding and agreed to proceed.  History of Present Illness: Brent Cruz is a 48 year old right-handed black male with a history of myasthenia gravis primarily with ocular features.  The patient has been lost to follow-up, he was last seen 2 and half years ago.  Apparently, he is given prednisone and Mestinon, he seems to improve for quite a number of months to years and then will have an exacerbation.  Within the last week, he is noted droopiness of the left with some double vision.  He apparently went off his medications at least a year and a half or 2 years ago.  He has done well off the medications.  He denies problems with chewing or swallowing.  He denies weakness of the extremities.  He has ptosis mainly on the left eye but occasionally on the right.  He comes back for reevaluation.   Observations/Objective: The video evaluation reveals that there is prominent ptosis of the left eye.  His speech is well enunciated, not aphasic or dysarthric.  He is answering questions appropriately.  He is able to protrude the tongue in the midline with good lateral movement the tongue.  He has good finger-nose-finger heel-to-shin bilaterally.  Gait is normal.  Tandem gait is normal.  Romberg is negative.  With evaluation of extraocular movements, there is incomplete medial deviation of the left eye and incomplete superior deviation of the left eye.  The patient does report some subjective vision when looking to the right.  Assessment and Plan: 1.  Myasthenia gravis with  exacerbation  The patient has gone off of his medications, he will be started back on prednisone 10 mg daily and Mestinon 60 mg tablet, 1/2 tablet 4 times daily.  He will follow-up in about 4 months.  He currently has no insurance and no primary care physician.  Follow Up Instructions: 67-month follow-up with me.   I discussed the assessment and treatment plan with the patient. The patient was provided an opportunity to ask questions and all were answered. The patient agreed with the plan and demonstrated an understanding of the instructions.   The patient was advised to call back or seek an in-person evaluation if the symptoms worsen or if the condition fails to improve as anticipated.  I provided 25 minutes of non-face-to-face time during this encounter.   Kathrynn Ducking, MD

## 2018-09-29 ENCOUNTER — Other Ambulatory Visit: Payer: Self-pay | Admitting: Physician Assistant

## 2018-09-29 DIAGNOSIS — I10 Essential (primary) hypertension: Secondary | ICD-10-CM

## 2018-10-14 ENCOUNTER — Encounter (HOSPITAL_COMMUNITY): Payer: Self-pay

## 2018-10-14 ENCOUNTER — Ambulatory Visit (HOSPITAL_COMMUNITY)
Admission: EM | Admit: 2018-10-14 | Discharge: 2018-10-14 | Disposition: A | Discharge status: Home / Self Care | Payer: HRSA Program | Attending: Family Medicine | Admitting: Family Medicine

## 2018-10-14 ENCOUNTER — Other Ambulatory Visit: Payer: Self-pay

## 2018-10-14 ENCOUNTER — Telehealth: Payer: Self-pay | Admitting: Internal Medicine

## 2018-10-14 DIAGNOSIS — U071 COVID-19: Secondary | ICD-10-CM | POA: Diagnosis not present

## 2018-10-14 DIAGNOSIS — R05 Cough: Secondary | ICD-10-CM

## 2018-10-14 DIAGNOSIS — Z87891 Personal history of nicotine dependence: Secondary | ICD-10-CM | POA: Insufficient documentation

## 2018-10-14 DIAGNOSIS — I10 Essential (primary) hypertension: Secondary | ICD-10-CM | POA: Diagnosis present

## 2018-10-14 DIAGNOSIS — Z20828 Contact with and (suspected) exposure to other viral communicable diseases: Secondary | ICD-10-CM

## 2018-10-14 DIAGNOSIS — Z79899 Other long term (current) drug therapy: Secondary | ICD-10-CM | POA: Diagnosis not present

## 2018-10-14 DIAGNOSIS — R6889 Other general symptoms and signs: Secondary | ICD-10-CM | POA: Diagnosis present

## 2018-10-14 DIAGNOSIS — Z20822 Contact with and (suspected) exposure to covid-19: Secondary | ICD-10-CM

## 2018-10-14 DIAGNOSIS — R059 Cough, unspecified: Secondary | ICD-10-CM

## 2018-10-14 HISTORY — DX: COVID-19: U07.1

## 2018-10-14 NOTE — Discharge Instructions (Addendum)
Your COVID test is pending.  You should self quarantine until your test result is back and is negative.    Go to the emergency department if you develop shortness of breath, high fever, severe diarrhea, or other concerning symptoms.    Your blood pressure is elevated today at 155/97.  Please have this rechecked by your primary care provider in 2 weeks.  If you do not have a primary care provider, one is suggested below.

## 2018-10-14 NOTE — Telephone Encounter (Signed)
Patient on the phone stating has [place] assigned to [facility] and is calling requesting appointment to be seen tomorrow due to be out of work since Monday for been having  headache and abdominal discomfort like wanted to throw up this morning. Patient denied been exposed to someone with [place]. Patient stated needs a Doctor  letter to be able to come back to work  Routing to [employer] to Triage patient.

## 2018-10-14 NOTE — ED Triage Notes (Signed)
Patient presents to Urgent Care with complaints of brief cough, headache, and some nausea since the past couple days (mostly yesterday). Patient reports he feels good now that he ate some food and would like a note to return to work. Pt tried to get in with his PCP and they could not get him in until November.

## 2018-10-14 NOTE — Telephone Encounter (Signed)
Spoke with patient. Informed we could put him on a waiting list to be seen for new patient appointment. But we did not see him for this issue. Informed he can try urgent care if a return to work note is all that is needed. Patient verbalized understanding and ill be placed on waiting list.

## 2018-10-14 NOTE — ED Provider Notes (Signed)
Media    CSN: [id number] Arrival date & time: 10/14/18  1725      History   Chief Complaint Chief Complaint  Patient presents with  . Letter for School/Work    HPI Brent Cruz is a 48 y.o. male.   Patient presents with nonproductive cough, headache, nausea x 2-3days.  He states his work is requiring him to have a note before he returns.  He denies fever, chills, weakness, dizziness, chest pain, shortness of breath, vomiting, diarrhea, or other symptoms.  Past medical history is significant for hypertension, myasthenia gravis, Bell's palsy.  The history is provided by the patient.    Past Medical History:  Diagnosis Date  . Bell's palsy    Recurrent  . Hypertension   . Myasthenia gravis with exacerbation ([place]) 03/30/2014  . Obesity     Patient Active Problem List   Diagnosis Date Noted  . At risk for acute ischemic cardiac event 06/10/2017  . Myasthenia gravis with exacerbation ([place]) 03/30/2014  . Essential hypertension 03/15/2014  . Bell's palsy 03/04/2012    Past Surgical History:  Procedure Laterality Date  . tonsil abscess         Home Medications    Prior to Admission medications   Medication Sig Start Date End Date Taking? Authorizing Provider  lisinopril-hydrochlorothiazide (PRINZIDE,ZESTORETIC) 10-12.5 MG tablet Take 1 tablet by mouth daily. 06/10/17  Yes Tereasa Coop, PA-C  pyridostigmine (MESTINON) 60 MG tablet Take 0.5 tablets (30 mg total) by mouth 4 (four) times daily. 06/18/18  Yes Kathrynn Ducking, MD  predniSONE (DELTASONE) 10 MG tablet Take 1 tablet (10 mg total) by mouth daily with breakfast. 06/18/18   Kathrynn Ducking, MD    Family History Family History  Problem Relation Age of Onset  . Healthy Mother   . Glaucoma Father   . Diabetes Paternal Grandfather     Social History Social History   Tobacco Use  . Smoking status: Former Smoker    Quit date: 02/12/1989    Years since quitting: 29.6  . Smokeless tobacco:  Never Used  Substance Use Topics  . Alcohol use: Yes    Comment: Consumes alcohol on occasion  . Drug use: No     Allergies   Patient has no known allergies.   Review of Systems Review of Systems  Constitutional: Negative for chills and fever.  HENT: Negative for ear pain and sore throat.   Eyes: Negative for pain and visual disturbance.  Respiratory: Positive for cough. Negative for shortness of breath.   Cardiovascular: Negative for chest pain and palpitations.  Gastrointestinal: Positive for nausea. Negative for abdominal pain, diarrhea and vomiting.  Genitourinary: Negative for dysuria and hematuria.  Musculoskeletal: Negative for arthralgias and back pain.  Skin: Negative for color change and rash.  Neurological: Positive for headaches. Negative for dizziness, tremors, seizures, syncope, facial asymmetry, speech difficulty, weakness, light-headedness and numbness.  All other systems reviewed and are negative.    Physical Exam Triage Vital Signs ED Triage Vitals  Enc Vitals Group     BP 10/14/18 1840 (!) 155/97     Pulse Rate 10/14/18 1840 98     Resp 10/14/18 1840 16     Temp 10/14/18 1840 99.8 F (37.7 C)     Temp Source 10/14/18 1840 Oral     SpO2 10/14/18 1840 98 %     Weight --      Height --      Head Circumference --  Peak Flow --      Pain Score 10/14/18 1838 0     Pain Loc --      Pain Edu? --      Excl. in [place]? --    No data found.  Updated Vital Signs BP (!) 155/97 (BP Location: Left Arm)   Pulse 98   Temp 99.8 F (37.7 C) (Oral)   Resp 16   SpO2 98%   Visual Acuity Right Eye Distance:   Left Eye Distance:   Bilateral Distance:    Right Eye Near:   Left Eye Near:    Bilateral Near:     Physical Exam Vitals signs and nursing note reviewed.  Constitutional:      Appearance: He is well-developed.  HENT:     Head: Normocephalic and atraumatic.     Right Ear: Tympanic membrane normal.     Left Ear: Tympanic membrane normal.      Nose: Nose normal.     Mouth/Throat:     Mouth: Mucous membranes are moist.     Pharynx: Oropharynx is clear.  Eyes:     Conjunctiva/sclera: Conjunctivae normal.  Neck:     Musculoskeletal: Neck supple.  Cardiovascular:     Rate and Rhythm: Normal rate and regular rhythm.     Heart sounds: No murmur.  Pulmonary:     Effort: Pulmonary effort is normal. No respiratory distress.     Breath sounds: Normal breath sounds.  Abdominal:     General: Bowel sounds are normal.     Palpations: Abdomen is soft.     Tenderness: There is no abdominal tenderness. There is no guarding or rebound.  Skin:    General: Skin is warm and dry.     Findings: No rash.  Neurological:     Mental Status: He is alert.      UC Treatments / Results  Labs (all labs ordered are listed, but only abnormal results are displayed) Labs Reviewed  NOVEL CORONAVIRUS, NAA (HOSP ORDER, SEND-OUT TO REF LAB; TAT 18-24 HRS)    EKG   Radiology No results found.  Procedures Procedures (including critical care time)  Medications Ordered in UC Medications - No data to display  Initial Impression / Assessment and Plan / UC Course  I have reviewed the triage vital signs and the nursing notes.  Pertinent labs & imaging results that were available during my care of the patient were reviewed by me and considered in my medical decision making (see chart for details).   Cough, suspected COVID.  Elevated blood pressure with known diagnosis of hypertension.  COVID test performed here.  Instructed patient to self quarantine until his test results are back.  Instructed patient to go to the emergency department if he develops high fever, shortness of breath, severe diarrhea, or other concerning symptoms.  Discussed with patient that his blood pressure is elevated today and that he should have this rechecked by his PCP in 2 to 4 weeks.  Suggestion for PCP provided as patient does not have one listed.  Patient agrees with plan of  care.    Final Clinical Impressions(s) / UC Diagnoses   Final diagnoses:  Elevated blood pressure reading in office with diagnosis of hypertension  Suspected Covid-19 Virus Infection  Cough     Discharge Instructions     Your COVID test is pending.  You should self quarantine until your test result is back and is negative.    Go to the emergency department if  you develop shortness of breath, high fever, severe diarrhea, or other concerning symptoms.    Your blood pressure is elevated today at 155/97.  Please have this rechecked by your primary care provider in 2 weeks.  If you do not have a primary care provider, one is suggested below.       ED Prescriptions    None     Controlled Substance Prescriptions [place] Controlled Substance Registry consulted? Not Applicable   Sharion Balloon, NP 10/14/18 1918

## 2018-10-15 ENCOUNTER — Telehealth (HOSPITAL_COMMUNITY): Payer: Self-pay | Admitting: Emergency Medicine

## 2018-10-15 DIAGNOSIS — I10 Essential (primary) hypertension: Secondary | ICD-10-CM

## 2018-10-15 MED ORDER — LISINOPRIL-HYDROCHLOROTHIAZIDE 10-12.5 MG PO TABS: 1.0000 | ORAL_TABLET | Freq: Every day | ORAL | 0 refills | Status: DC

## 2018-10-15 NOTE — Telephone Encounter (Signed)
Patient seen yesterday.  Patient did not realize he was out of blood pressure medicine.  Requesting refill.  Kelly tate, np agred to refill of blood pressure medicine

## 2018-10-16 ENCOUNTER — Telehealth (HOSPITAL_COMMUNITY): Payer: Self-pay | Admitting: Emergency Medicine

## 2018-10-16 LAB — NOVEL CORONAVIRUS, NAA (HOSP ORDER, SEND-OUT TO REF LAB; TAT 18-24 HRS): SARS-CoV-2, NAA: DETECTED — AB

## 2018-10-16 NOTE — Telephone Encounter (Signed)
Covid positive, pt contacted and made aware. Work note sent

## 2018-10-23 ENCOUNTER — Telehealth (HOSPITAL_COMMUNITY): Payer: Self-pay | Admitting: Emergency Medicine

## 2018-10-23 NOTE — Telephone Encounter (Signed)
Pt called stating he finished his quaratine, no fever, symptoms improved. Has met guidelines to return to work. Work note sent to [employer].

## 2018-12-02 ENCOUNTER — Encounter: Payer: Self-pay | Admitting: Neurology

## 2018-12-02 ENCOUNTER — Ambulatory Visit: Payer: Self-pay | Admitting: Neurology

## 2018-12-02 ENCOUNTER — Telehealth: Payer: Self-pay | Admitting: Neurology

## 2018-12-02 NOTE — Telephone Encounter (Signed)
This patient canceled the same day of a revisit appointment. 

## 2019-01-16 ENCOUNTER — Ambulatory Visit: Payer: Self-pay | Admitting: Internal Medicine

## 2019-04-30 ENCOUNTER — Telehealth: Payer: Self-pay | Admitting: Neurology

## 2019-04-30 MED ORDER — PREDNISONE 10 MG PO TABS: 10.0000 mg | ORAL_TABLET | Freq: Every day | ORAL | 1 refills | Status: DC

## 2019-04-30 MED ORDER — PYRIDOSTIGMINE BROMIDE 60 MG PO TABS: 30.0000 mg | ORAL_TABLET | Freq: Four times a day (QID) | ORAL | 3 refills | Status: DC

## 2019-04-30 NOTE — Telephone Encounter (Signed)
Prescriptions for prednisone and Mestinon will be sent in.

## 2019-04-30 NOTE — Telephone Encounter (Signed)
Dr.Willis pt was last seen 06/2018. Pt had appt in 11/2018 and was a no show. He has an appt with you in July 2021. Do you want him to see Judson Roch NP sooner. He is asking for refills on medication.

## 2019-04-30 NOTE — Telephone Encounter (Signed)
If patient calls back per Dr.Willis he needs to be seen sooner than 08/2019 please give him sooner appt.. PT was last seen 06/2018,and was a no show for appt in 11/2018. A temporary refill was sent for both meds. He can be schedule with Judson Roch NP in May 2021.  Vm was left for pt with the above message. If pt calls back reschedule with Judson Roch NP in APril or May. Also Cancel Dr. Jannifer Franklin appt in July 2021 when he calls back.

## 2019-04-30 NOTE — Telephone Encounter (Signed)
1) Medication(s) Requested (by name): pyridostigmine (MESTINON) 60 MG tablet [ predniSONE (DELTASONE) 10 MG tablet   2) Pharmacy of Choice: [facility] [place]  [phone], [place] [address]

## 2019-05-14 ENCOUNTER — Encounter (HOSPITAL_COMMUNITY): Payer: Self-pay | Admitting: Emergency Medicine

## 2019-05-14 ENCOUNTER — Ambulatory Visit (HOSPITAL_COMMUNITY)
Admission: EM | Admit: 2019-05-14 | Discharge: 2019-05-14 | Disposition: A | Discharge status: Home / Self Care | Payer: 59 | Attending: Family Medicine | Admitting: Family Medicine

## 2019-05-14 ENCOUNTER — Other Ambulatory Visit: Payer: Self-pay

## 2019-05-14 DIAGNOSIS — I10 Essential (primary) hypertension: Secondary | ICD-10-CM

## 2019-05-14 MED ORDER — LISINOPRIL 20 MG PO TABS: 20.0000 mg | ORAL_TABLET | Freq: Every day | ORAL | 2 refills | Status: DC

## 2019-05-14 NOTE — ED Provider Notes (Signed)
Brent Cruz   [id number] 05/14/19 Arrival Time: [id number]  ASSESSMENT & PLAN:  1. Uncontrolled hypertension   2. Essential hypertension     No s/s of hypertensive urgency.  Begin: Meds ordered this encounter  Medications  . lisinopril (ZESTRIL) 20 MG tablet    Sig: Take 1 tablet (20 mg total) by mouth daily.    Dispense:  30 tablet    Refill:  2   Declines BMP.  Recommend: Follow-up Information    Schedule an appointment as soon as possible for a visit  with Primary Care at [facility].   Specialty: Family Medicine Contact information: [address] [place] [phone]          Reviewed expectations re: course of current medical issues. Questions answered. Outlined signs and symptoms indicating need for more acute intervention. Patient verbalized understanding. After Visit Summary given.   SUBJECTIVE:  Brent Cruz is a 49 y.o. male who presents with concerns regarding increased blood pressures. Has not taken BP meds in approx 9 months.  He reports no chest pain on exertion, no dyspnea on exertion, no swelling of ankles, no orthostatic dizziness or lightheadedness, no orthopnea or paroxysmal nocturnal dyspnea and no palpitations.  Questions occasional and mild LE edema; none currently.  Social History   Tobacco Use  Smoking Status Former Smoker  . Quit date: 02/12/1989  . Years since quitting: 30.2  Smokeless Tobacco Never Used     OBJECTIVE:  Vitals:   05/14/19 1427 05/14/19 1428  BP:  (!) 187/113  Pulse: 86   Resp: 16   Temp: 99.8 F (37.7 C)   TempSrc: Oral   SpO2: 97%     General appearance: alert; no distress Eyes: PERRLA; EOMI HENT: normocephalic; atraumatic Neck: supple Lungs: clear to auscultation bilaterally Abdomen: obese Skin: warm and dry Psychological: alert and cooperative; normal mood and affect   No Known Allergies  Past Medical History:  Diagnosis Date  . Bell's palsy     Recurrent  . Hypertension   . Myasthenia gravis with exacerbation ([place]) 03/30/2014  . Obesity    Social History   Socioeconomic History  . Marital status: Single    Spouse name: Not on file  . Number of children: 2  . Years of education: College  . Highest education level: Not on file  Occupational History  . Occupation: Driver [employer]  Tobacco Use  . Smoking status: Former Smoker    Quit date: 02/12/1989    Years since quitting: 30.2  . Smokeless tobacco: Never Used  Substance and Sexual Activity  . Alcohol use: Yes    Comment: Consumes alcohol on occasion  . Drug use: No  . Sexual activity: Not on file  Other Topics Concern  . Not on file  Social History Narrative   Patient is right handed.   Patient does not drink caffeine.   Social Determinants of Health   Financial Resource Strain:   . Difficulty of Paying Living Expenses:   Food Insecurity:   . Worried About Charity fundraiser in the Last Year:   . Arboriculturist in the Last Year:   Transportation Needs:   . Film/video editor (Medical):   Marland Kitchen Lack of Transportation (Non-Medical):   Physical Activity:   . Days of Exercise per Week:   . Minutes of Exercise per Session:   Stress:   . Feeling of Stress :   Social Connections:   . Frequency of Communication  with Friends and Family:   . Frequency of Social Gatherings with Friends and Family:   . Attends Religious Services:   . Active Member of Clubs or Organizations:   . Attends Archivist Meetings:   Marland Kitchen Marital Status:   Intimate Partner Violence:   . Fear of Current or Ex-Partner:   . Emotionally Abused:   Marland Kitchen Physically Abused:   . Sexually Abused:    Family History  Problem Relation Age of Onset  . Healthy Mother   . Glaucoma Father   . Diabetes Paternal Grandfather    Past Surgical History:  Procedure Laterality Date  . tonsil abscess        Vanessa Kick, MD 05/14/19 1446

## 2019-05-14 NOTE — ED Triage Notes (Signed)
PT has been without BP meds for 9 months. He has also noticed swelling on his feet.

## 2019-09-01 ENCOUNTER — Ambulatory Visit (INDEPENDENT_AMBULATORY_CARE_PROVIDER_SITE_OTHER): Payer: 59 | Admitting: Neurology

## 2019-09-01 ENCOUNTER — Encounter: Payer: Self-pay | Admitting: Neurology

## 2019-09-01 VITALS — BP 183/108 | HR 96 | Ht 65.5 in | Wt 335.0 lb

## 2019-09-01 DIAGNOSIS — G7001 Myasthenia gravis with (acute) exacerbation: Secondary | ICD-10-CM

## 2019-09-01 DIAGNOSIS — Z5181 Encounter for therapeutic drug level monitoring: Secondary | ICD-10-CM

## 2019-09-01 MED ORDER — PYRIDOSTIGMINE BROMIDE 60 MG PO TABS: 30.0000 mg | ORAL_TABLET | Freq: Four times a day (QID) | ORAL | 5 refills | Status: DC

## 2019-09-01 MED ORDER — PREDNISONE 10 MG PO TABS: 10.0000 mg | ORAL_TABLET | Freq: Every day | ORAL | 1 refills | Status: DC

## 2019-09-01 MED ORDER — LISINOPRIL 20 MG PO TABS: 20.0000 mg | ORAL_TABLET | Freq: Every day | ORAL | 3 refills | Status: DC

## 2019-09-01 NOTE — Progress Notes (Signed)
Reason for visit: Myasthenia gravis  Brent Cruz is an 49 y.o. male  History of present illness:  Brent Cruz is a 49 year old right-handed black male with a history of morbid obesity, hypertension, prediabetes and primary ocular myasthenia gravis.  The patient has not been good about regular follow-up through this office.  He claims that he no longer has a primary care physician.  He has been off of all of his medications for least several weeks.  The patient seems to do well when he takes his medications but when the medication runs out he has significant issues with some double vision and ptosis primarily the right eye.  The patient denies any problems with swallowing or choking or chewing.  He denies issues with breathing or difficulty with weakness of the arms or legs.  He comes in today with extremely elevated blood pressure.  He reports some swelling in the lower extremities as well off and on.  He works as a Recruitment consultant.  He does report some daytime drowsiness at times, but he does not report that this is a big issue for him.  Past Medical History:  Diagnosis Date  . Bell's palsy    Recurrent  . Hypertension   . Myasthenia gravis with exacerbation ([place]) 03/30/2014  . Obesity     Past Surgical History:  Procedure Laterality Date  . tonsil abscess      Family History  Problem Relation Age of Onset  . Healthy Mother   . Glaucoma Father   . Diabetes Paternal Grandfather     Social history:  reports that he quit smoking about 30 years ago. He has never used smokeless tobacco. He reports current alcohol use. He reports that he does not use drugs.   No Known Allergies  Medications:  Prior to Admission medications   Medication Sig Start Date End Date Taking? Authorizing Provider  lisinopril (ZESTRIL) 20 MG tablet Take 1 tablet (20 mg total) by mouth daily. 05/14/19  Yes Hagler, Aaron Edelman, MD  predniSONE (DELTASONE) 10 MG tablet Take 1 tablet (10 mg total) by mouth daily with  breakfast. 04/30/19  Yes Kathrynn Ducking, MD  pyridostigmine (MESTINON) 60 MG tablet Take 0.5 tablets (30 mg total) by mouth 4 (four) times daily. 04/30/19  Yes Kathrynn Ducking, MD    ROS:  Out of a complete 14 system review of symptoms, the patient complains only of the following symptoms, and all other reviewed systems are negative.  Double vision Droopy eyelid Weight gain  Blood pressure (!) 183/108, pulse 96, height 5' 5.5" (1.664 m), weight (!) 335 lb (152 kg).  Physical Exam  General: The patient is alert and cooperative at the time of the examination.  The patient is morbidly obese.  Skin: 1-2+ edema below the knees is seen bilaterally..   Neurologic Exam  Mental status: The patient is alert and oriented x 3 at the time of the examination. The patient has apparent normal recent and remote memory, with an apparently normal attention span and concentration ability.   Cranial nerves: Facial symmetry is not present.  There is some ptosis of the right eye.  Speech is normal, no aphasia or dysarthria is noted. Extraocular movements are full, with exception of there is some incomplete medial deviation of the left eye with rightward gaze. Visual fields are full.  With the eyes superiorly deviated for 1 minute, the patient reports double vision within 20 seconds, he has progressive worsening of right-sided ptosis.  Motor: The  patient has good strength in all 4 extremities.  With arms outstretched for 1 minute, no fatigable weakness of the deltoid muscles was seen.  Sensory examination: Soft touch sensation is symmetric on the face, arms, and legs.  Coordination: The patient has good finger-nose-finger and heel-to-shin bilaterally.  Gait and station: The patient has a normal gait. Tandem gait is normal. Romberg is negative. No drift is seen.  Reflexes: Deep tendon reflexes are symmetric.   Assessment/Plan:  1.  Myasthenia gravis, ocular  2.  Morbid obesity  3.   Prediabetes  4.  Untreated hypertension  The patient was placed back on prednisone and Mestinon.  He has a history of hypertension and prediabetes, the prednisone may worsen both of these issues.  I will write a prescription for his lisinopril until he can get in to see another primary care physician.  I will make a referral to Dr. Kevan Ny.  Blood work will be done today.  He will follow-up here in 4 months.  I have indicated that he needs to be more careful about medical follow-up and maintaining medical therapy.  On medication, the patient indicates that his myasthenia gravis is well controlled.  Jill Alexanders MD 09/01/2019 7:22 AM  [facility] [address] [place], [place] [address]  Phone [phone] Fax [phone]

## 2019-09-02 LAB — COMPREHENSIVE METABOLIC PANEL
ALT: 16 IU/L (ref 0–44)
AST: 15 IU/L (ref 0–40)
Albumin/Globulin Ratio: 1.1 — ABNORMAL LOW (ref 1.2–2.2)
Albumin: 3.9 g/dL — ABNORMAL LOW (ref 4.0–5.0)
Alkaline Phosphatase: 63 IU/L (ref 48–121)
BUN/Creatinine Ratio: 6 — ABNORMAL LOW (ref 9–20)
BUN: 6 mg/dL (ref 6–24)
Bilirubin Total: 0.3 mg/dL (ref 0.0–1.2)
CO2: 29 mmol/L (ref 20–29)
Calcium: 9.3 mg/dL (ref 8.7–10.2)
Chloride: 101 mmol/L (ref 96–106)
Creatinine, Ser: 0.98 mg/dL (ref 0.76–1.27)
GFR calc Af Amer: 103 mL/min/{1.73_m2} (ref >59)
GFR calc non Af Amer: 90 mL/min/{1.73_m2} (ref >59)
Globulin, Total: 3.4 g/dL (ref 1.5–4.5)
Glucose: 104 mg/dL — ABNORMAL HIGH (ref 65–99)
Potassium: 4.7 mmol/L (ref 3.5–5.2)
Sodium: 141 mmol/L (ref 134–144)
Total Protein: 7.3 g/dL (ref 6.0–8.5)

## 2019-09-02 LAB — CBC WITH DIFFERENTIAL/PLATELET
Basophils Absolute: 0.1 10*3/uL (ref 0.0–0.2)
Basos: 1 %
EOS (ABSOLUTE): 0.1 10*3/uL (ref 0.0–0.4)
Eos: 2 %
Hematocrit: 44 % (ref 37.5–51.0)
Hemoglobin: 13.5 g/dL (ref 13.0–17.7)
Immature Grans (Abs): 0 10*3/uL (ref 0.0–0.1)
Immature Granulocytes: 0 %
Lymphocytes Absolute: 1.6 10*3/uL (ref 0.7–3.1)
Lymphs: 28 %
MCH: 25.9 pg — ABNORMAL LOW (ref 26.6–33.0)
MCHC: 30.7 g/dL — ABNORMAL LOW (ref 31.5–35.7)
MCV: 84 fL (ref 79–97)
Monocytes Absolute: 0.7 10*3/uL (ref 0.1–0.9)
Monocytes: 12 %
Neutrophils Absolute: 3.1 10*3/uL (ref 1.4–7.0)
Neutrophils: 57 %
Platelets: 306 10*3/uL (ref 150–450)
RBC: 5.22 x10E6/uL (ref 4.14–5.80)
RDW: 13.5 % (ref 11.6–15.4)
WBC: 5.6 10*3/uL (ref 3.4–10.8)

## 2019-12-03 ENCOUNTER — Other Ambulatory Visit: Payer: Self-pay | Admitting: Emergency Medicine

## 2019-12-03 NOTE — Telephone Encounter (Signed)
Called and spoke to patient regarding prescription.  Dr. Jannifer Franklin referred patient on 09/01/2019 to Dr. Kevan Ny for [facility] as the patient didn't have a PCP.  Patient stated he didn't want to have to come here every time he needed a refill and asked for Dr. Forbes Cellar information.   Gave patient Dr. Randel Pigg information, stated he would follow up with establishing care with Dr. Marlou Sa as a PCP and for refills on medication.  Patient expressed appreciation for information.

## 2019-12-14 ENCOUNTER — Other Ambulatory Visit: Payer: Self-pay | Admitting: Emergency Medicine

## 2019-12-14 NOTE — Telephone Encounter (Signed)
Called and spoke to patient.  Patient was sent referral to Dr. Randel Pigg office for all PCP care and prescription refill.  We received refill request for Lisinopril.  Patient stated he didn't know where Dr. Marlou Sa was located, gave patient address as well as phone number.  Patient stated he was not out of the Lisinopril yet and will call Dr. Marlou Sa today for appointment to establish care and medication management.

## 2019-12-29 NOTE — Telephone Encounter (Signed)
Called and LVM for pt to let him know he will need to keep appt with SS,NP and Dr. Marlou Sa. Explained we are his neurology office that manages his neurological care and Dr. Marlou Sa will be his primary care physician.

## 2019-12-29 NOTE — Telephone Encounter (Signed)
Pt is calling re: his appointment with Dr Marlou Sa and his f/u with Sarah,NP on 11-18.  Pt is asking if his appointment with Sarah,NP needs to be cancelled and he just f/u with Dr Marlou Sa.  Please call and clarify.

## 2019-12-31 ENCOUNTER — Ambulatory Visit: Payer: 59 | Admitting: Neurology

## 2020-04-12 ENCOUNTER — Encounter: Payer: 59 | Admitting: Gastroenterology

## 2020-04-20 ENCOUNTER — Ambulatory Visit: Payer: 59 | Admitting: Gastroenterology

## 2020-04-23 ENCOUNTER — Other Ambulatory Visit (HOSPITAL_COMMUNITY): Payer: 59

## 2020-04-25 ENCOUNTER — Other Ambulatory Visit (HOSPITAL_COMMUNITY)
Admission: RE | Admit: 2020-04-25 | Discharge: 2020-04-25 | Disposition: A | Discharge status: Home / Self Care | Payer: 59 | Source: Ambulatory Visit | Attending: Gastroenterology | Admitting: Gastroenterology

## 2020-04-25 ENCOUNTER — Ambulatory Visit: Payer: Self-pay | Admitting: Surgery

## 2020-04-25 ENCOUNTER — Encounter (HOSPITAL_COMMUNITY): Payer: Self-pay | Admitting: Surgery

## 2020-04-25 DIAGNOSIS — Z01812 Encounter for preprocedural laboratory examination: Secondary | ICD-10-CM | POA: Insufficient documentation

## 2020-04-25 DIAGNOSIS — Z20822 Contact with and (suspected) exposure to covid-19: Secondary | ICD-10-CM | POA: Insufficient documentation

## 2020-04-25 LAB — SARS CORONAVIRUS 2 (TAT 6-24 HRS): SARS Coronavirus 2: NEGATIVE

## 2020-04-25 NOTE — Progress Notes (Signed)
Spoke with pt for pre-op call. Pt denies cardiac history and diabetes.   Covid test done today, result pending. Pt states he's been in quarantine since the test was done and understands that he stays in quarantine until he comes to the hospital tomorrow.

## 2020-04-25 NOTE — Anesthesia Preprocedure Evaluation (Signed)
Anesthesia Evaluation  Patient identified by MRN, date of birth, ID band Patient awake    Reviewed: Allergy & Precautions, NPO status , Patient's Chart, lab work & pertinent test results  Airway Mallampati: III  TM Distance: >3 FB Neck ROM: Full    Dental  (+) Teeth Intact, Dental Advisory Given   Pulmonary asthma , former smoker,    Pulmonary exam normal breath sounds clear to auscultation       Cardiovascular hypertension, Pt. on medications Normal cardiovascular exam Rhythm:Regular Rate:Normal     Neuro/Psych Myasthenia gravis   Neuromuscular disease    GI/Hepatic Neg liver ROS, UMBILICAL HERNIA   Endo/Other  Morbid obesity  Renal/GU negative Renal ROS     Musculoskeletal negative musculoskeletal ROS (+)   Abdominal   Peds  Hematology negative hematology ROS (+)   Anesthesia Other Findings   Reproductive/Obstetrics                            Anesthesia Physical Anesthesia Plan  ASA: III  Anesthesia Plan: General   Post-op Pain Management:  Regional for Post-op pain   Induction: Intravenous  PONV Risk Score and Plan: 2 and Midazolam, Dexamethasone and Ondansetron  Airway Management Planned: Oral ETT  Additional Equipment:   Intra-op Plan:   Post-operative Plan: Extubation in OR  Informed Consent: I have reviewed the patients History and Physical, chart, labs and discussed the procedure including the risks, benefits and alternatives for the proposed anesthesia with the patient or authorized representative who has indicated his/her understanding and acceptance.     Dental advisory given  Plan Discussed with: CRNA  Anesthesia Plan Comments:        Anesthesia Quick Evaluation

## 2020-04-26 ENCOUNTER — Other Ambulatory Visit: Payer: Self-pay

## 2020-04-26 ENCOUNTER — Encounter (HOSPITAL_COMMUNITY): Payer: Self-pay | Admitting: Surgery

## 2020-04-26 ENCOUNTER — Inpatient Hospital Stay (HOSPITAL_COMMUNITY): Payer: 59 | Admitting: Anesthesiology

## 2020-04-26 ENCOUNTER — Inpatient Hospital Stay (HOSPITAL_COMMUNITY)
Admission: RE | Admit: 2020-04-26 | Discharge: 2020-04-29 | DRG: 988 | Disposition: A | Discharge status: Home / Self Care | Payer: 59 | Attending: Surgery | Admitting: Surgery

## 2020-04-26 ENCOUNTER — Encounter (HOSPITAL_COMMUNITY)
Admission: RE | Disposition: A | Discharge status: Home / Self Care | Payer: Self-pay | Source: Home / Self Care | Attending: Surgery

## 2020-04-26 DIAGNOSIS — Z6841 Body Mass Index (BMI) 40.0 and over, adult: Secondary | ICD-10-CM

## 2020-04-26 DIAGNOSIS — Z8616 Personal history of COVID-19: Secondary | ICD-10-CM

## 2020-04-26 DIAGNOSIS — J45909 Unspecified asthma, uncomplicated: Secondary | ICD-10-CM | POA: Diagnosis present

## 2020-04-26 DIAGNOSIS — G7000 Myasthenia gravis without (acute) exacerbation: Secondary | ICD-10-CM | POA: Diagnosis present

## 2020-04-26 DIAGNOSIS — R0902 Hypoxemia: Principal | ICD-10-CM | POA: Diagnosis present

## 2020-04-26 DIAGNOSIS — I10 Essential (primary) hypertension: Secondary | ICD-10-CM | POA: Diagnosis present

## 2020-04-26 DIAGNOSIS — Z8719 Personal history of other diseases of the digestive system: Secondary | ICD-10-CM

## 2020-04-26 DIAGNOSIS — Z833 Family history of diabetes mellitus: Secondary | ICD-10-CM

## 2020-04-26 DIAGNOSIS — K436 Other and unspecified ventral hernia with obstruction, without gangrene: Secondary | ICD-10-CM | POA: Diagnosis present

## 2020-04-26 DIAGNOSIS — Z20822 Contact with and (suspected) exposure to covid-19: Secondary | ICD-10-CM | POA: Diagnosis present

## 2020-04-26 DIAGNOSIS — Z87891 Personal history of nicotine dependence: Secondary | ICD-10-CM

## 2020-04-26 DIAGNOSIS — Z9889 Other specified postprocedural states: Secondary | ICD-10-CM

## 2020-04-26 DIAGNOSIS — Z83511 Family history of glaucoma: Secondary | ICD-10-CM

## 2020-04-26 HISTORY — PX: VENTRAL HERNIA REPAIR: SHX424

## 2020-04-26 HISTORY — DX: Unspecified asthma, uncomplicated: J45.909

## 2020-04-26 HISTORY — PX: OTHER SURGICAL HISTORY: SHX169

## 2020-04-26 LAB — HIV ANTIBODY (ROUTINE TESTING W REFLEX): HIV Screen 4th Generation wRfx: NONREACTIVE

## 2020-04-26 SURGERY — REPAIR, HERNIA, VENTRAL
Anesthesia: General | Site: Abdomen

## 2020-04-26 MED ORDER — LISINOPRIL 20 MG PO TABS
20.0000 mg | ORAL_TABLET | Freq: Every day | ORAL | Status: DC
  Administered 2020-04-26 – 2020-04-29 (×4): 20 mg via ORAL
  Filled 2020-04-26 (×4): qty 1

## 2020-04-26 MED ORDER — CHLORHEXIDINE GLUCONATE CLOTH 2 % EX PADS: 6.0000 | MEDICATED_PAD | Freq: Once | CUTANEOUS | Status: DC

## 2020-04-26 MED ORDER — BUPIVACAINE LIPOSOME 1.3 % IJ SUSP
INTRAMUSCULAR | Status: DC | PRN
  Administered 2020-04-26 (×2): 5 mL via PERINEURAL

## 2020-04-26 MED ORDER — LIDOCAINE 2% (20 MG/ML) 5 ML SYRINGE
INTRAMUSCULAR | Status: DC | PRN
  Administered 2020-04-26: 80 mg via INTRAVENOUS

## 2020-04-26 MED ORDER — CHLORHEXIDINE GLUCONATE 0.12 % MT SOLN
15.0000 mL | Freq: Once | OROMUCOSAL | Status: AC
  Administered 2020-04-26: 15 mL via OROMUCOSAL

## 2020-04-26 MED ORDER — BUPIVACAINE-EPINEPHRINE (PF) 0.25% -1:200000 IJ SOLN
INTRAMUSCULAR | Status: AC
  Filled 2020-04-26: qty 60

## 2020-04-26 MED ORDER — LIDOCAINE 2% (20 MG/ML) 5 ML SYRINGE
INTRAMUSCULAR | Status: AC
  Filled 2020-04-26: qty 5

## 2020-04-26 MED ORDER — PHENYLEPHRINE HCL (PRESSORS) 10 MG/ML IV SOLN: INTRAVENOUS | Status: DC | PRN

## 2020-04-26 MED ORDER — PROPOFOL 10 MG/ML IV BOLUS
INTRAVENOUS | Status: AC
  Filled 2020-04-26: qty 40

## 2020-04-26 MED ORDER — SUGAMMADEX SODIUM 200 MG/2ML IV SOLN: INTRAVENOUS | Status: DC | PRN

## 2020-04-26 MED ORDER — ENOXAPARIN SODIUM 80 MG/0.8ML ~~LOC~~ SOLN
70.0000 mg | SUBCUTANEOUS | Status: DC
  Administered 2020-04-28 – 2020-04-29 (×2): 70 mg via SUBCUTANEOUS
  Filled 2020-04-26 (×3): qty 0.8

## 2020-04-26 MED ORDER — ACETAMINOPHEN 500 MG PO TABS
1000.0000 mg | ORAL_TABLET | ORAL | Status: AC
  Administered 2020-04-26: 1000 mg via ORAL
  Filled 2020-04-26: qty 2

## 2020-04-26 MED ORDER — ONDANSETRON HCL 4 MG/2ML IJ SOLN
INTRAMUSCULAR | Status: DC | PRN
  Administered 2020-04-26: 4 mg via INTRAVENOUS

## 2020-04-26 MED ORDER — LACTATED RINGERS IV SOLN: INTRAVENOUS | Status: DC | PRN

## 2020-04-26 MED ORDER — PYRIDOSTIGMINE BROMIDE 60 MG PO TABS
30.0000 mg | ORAL_TABLET | Freq: Four times a day (QID) | ORAL | Status: DC
  Administered 2020-04-26 – 2020-04-29 (×12): 30 mg via ORAL
  Filled 2020-04-26 (×16): qty 0.5

## 2020-04-26 MED ORDER — FENTANYL CITRATE (PF) 250 MCG/5ML IJ SOLN
INTRAMUSCULAR | Status: AC
  Filled 2020-04-26: qty 5

## 2020-04-26 MED ORDER — CHLORHEXIDINE GLUCONATE 0.12 % MT SOLN
OROMUCOSAL | Status: AC
  Filled 2020-04-26: qty 15

## 2020-04-26 MED ORDER — PHENYLEPHRINE HCL-NACL 10-0.9 MG/250ML-% IV SOLN
INTRAVENOUS | Status: DC | PRN
  Administered 2020-04-26: 50 ug/min via INTRAVENOUS

## 2020-04-26 MED ORDER — SUGAMMADEX SODIUM 200 MG/2ML IV SOLN
INTRAVENOUS | Status: DC | PRN
  Administered 2020-04-26: 300 mg via INTRAVENOUS
  Administered 2020-04-26: 100 mg via INTRAVENOUS

## 2020-04-26 MED ORDER — FENTANYL CITRATE (PF) 250 MCG/5ML IJ SOLN
INTRAMUSCULAR | Status: DC | PRN
  Administered 2020-04-26 (×2): 50 ug via INTRAVENOUS
  Administered 2020-04-26: 25 ug via INTRAVENOUS

## 2020-04-26 MED ORDER — BUPIVACAINE LIPOSOME 1.3 % IJ SUSP
20.0000 mL | Freq: Once | INTRAMUSCULAR | Status: DC
  Filled 2020-04-26: qty 20

## 2020-04-26 MED ORDER — LACTATED RINGERS IV SOLN: INTRAVENOUS | Status: DC

## 2020-04-26 MED ORDER — IBUPROFEN 800 MG PO TABS
800.0000 mg | ORAL_TABLET | Freq: Three times a day (TID) | ORAL | Status: DC
  Administered 2020-04-26 – 2020-04-29 (×9): 800 mg via ORAL
  Filled 2020-04-26 (×9): qty 1

## 2020-04-26 MED ORDER — PYRIDOSTIGMINE BROMIDE 60 MG PO TABS
30.0000 mg | ORAL_TABLET | Freq: Once | ORAL | Status: AC
  Administered 2020-04-26: 30 mg via ORAL
  Filled 2020-04-26: qty 0.5

## 2020-04-26 MED ORDER — PROPOFOL 10 MG/ML IV BOLUS
INTRAVENOUS | Status: DC | PRN
  Administered 2020-04-26: 200 mg via INTRAVENOUS
  Administered 2020-04-26: 20 mg via INTRAVENOUS
  Administered 2020-04-26: 50 mg via INTRAVENOUS

## 2020-04-26 MED ORDER — PHENYLEPHRINE 40 MCG/ML (10ML) SYRINGE FOR IV PUSH (FOR BLOOD PRESSURE SUPPORT)
PREFILLED_SYRINGE | INTRAVENOUS | Status: AC
  Filled 2020-04-26: qty 10

## 2020-04-26 MED ORDER — MIDAZOLAM HCL 2 MG/2ML IJ SOLN
INTRAMUSCULAR | Status: DC | PRN
  Administered 2020-04-26 (×2): 1 mg via INTRAVENOUS

## 2020-04-26 MED ORDER — ROCURONIUM BROMIDE 10 MG/ML (PF) SYRINGE
PREFILLED_SYRINGE | INTRAVENOUS | Status: AC
  Filled 2020-04-26: qty 10

## 2020-04-26 MED ORDER — ONDANSETRON HCL 4 MG/2ML IJ SOLN
INTRAMUSCULAR | Status: AC
  Filled 2020-04-26: qty 2

## 2020-04-26 MED ORDER — DEXAMETHASONE SODIUM PHOSPHATE 10 MG/ML IJ SOLN
INTRAMUSCULAR | Status: AC
  Filled 2020-04-26: qty 1

## 2020-04-26 MED ORDER — DEXTROSE 5 % IV SOLN
3.0000 g | INTRAVENOUS | Status: DC
  Filled 2020-04-26: qty 3000

## 2020-04-26 MED ORDER — ORAL CARE MOUTH RINSE: 15.0000 mL | Freq: Once | OROMUCOSAL | Status: AC

## 2020-04-26 MED ORDER — BUPIVACAINE HCL (PF) 0.25 % IJ SOLN
INTRAMUSCULAR | Status: DC | PRN
  Administered 2020-04-26 (×2): 20 mL

## 2020-04-26 MED ORDER — CEFAZOLIN SODIUM-DEXTROSE 2-4 GM/100ML-% IV SOLN
INTRAVENOUS | Status: AC
  Filled 2020-04-26: qty 100

## 2020-04-26 MED ORDER — FENTANYL CITRATE (PF) 100 MCG/2ML IJ SOLN
25.0000 ug | INTRAMUSCULAR | Status: DC | PRN
  Administered 2020-04-26 (×3): 25 ug via INTRAVENOUS

## 2020-04-26 MED ORDER — PREDNISONE 10 MG PO TABS
10.0000 mg | ORAL_TABLET | Freq: Every day | ORAL | Status: DC
  Administered 2020-04-27 – 2020-04-29 (×3): 10 mg via ORAL
  Filled 2020-04-26 (×3): qty 1

## 2020-04-26 MED ORDER — ACETAMINOPHEN 500 MG PO TABS
1000.0000 mg | ORAL_TABLET | Freq: Four times a day (QID) | ORAL | Status: DC
  Administered 2020-04-26 – 2020-04-29 (×10): 1000 mg via ORAL
  Filled 2020-04-26 (×11): qty 2

## 2020-04-26 MED ORDER — OXYCODONE HCL 5 MG/5ML PO SOLN
ORAL | Status: AC
  Filled 2020-04-26: qty 10

## 2020-04-26 MED ORDER — HEPARIN SODIUM (PORCINE) 5000 UNIT/ML IJ SOLN
5000.0000 [IU] | Freq: Once | INTRAMUSCULAR | Status: AC
  Administered 2020-04-26: 5000 [IU] via SUBCUTANEOUS
  Filled 2020-04-26: qty 1

## 2020-04-26 MED ORDER — PREDNISONE 10 MG PO TABS
10.0000 mg | ORAL_TABLET | Freq: Once | ORAL | Status: AC
  Filled 2020-04-26: qty 1

## 2020-04-26 MED ORDER — 0.9 % SODIUM CHLORIDE (POUR BTL) OPTIME
TOPICAL | Status: DC | PRN
  Administered 2020-04-26: 1000 mL

## 2020-04-26 MED ORDER — FENTANYL CITRATE (PF) 100 MCG/2ML IJ SOLN
INTRAMUSCULAR | Status: AC
  Filled 2020-04-26: qty 2

## 2020-04-26 MED ORDER — MIDAZOLAM HCL 2 MG/2ML IJ SOLN
INTRAMUSCULAR | Status: AC
  Filled 2020-04-26: qty 2

## 2020-04-26 MED ORDER — ONDANSETRON 4 MG PO TBDP: 4.0000 mg | ORAL_TABLET | Freq: Four times a day (QID) | ORAL | Status: DC | PRN

## 2020-04-26 MED ORDER — ROCURONIUM BROMIDE 10 MG/ML (PF) SYRINGE
PREFILLED_SYRINGE | INTRAVENOUS | Status: DC | PRN
  Administered 2020-04-26: 20 mg via INTRAVENOUS
  Administered 2020-04-26: 30 mg via INTRAVENOUS
  Administered 2020-04-26: 50 mg via INTRAVENOUS

## 2020-04-26 MED ORDER — ONDANSETRON HCL 4 MG/2ML IJ SOLN: 4.0000 mg | Freq: Four times a day (QID) | INTRAMUSCULAR | Status: DC | PRN

## 2020-04-26 MED ORDER — PREDNISONE 20 MG PO TABS
ORAL_TABLET | ORAL | Status: AC
  Administered 2020-04-26: 10 mg via ORAL
  Filled 2020-04-26: qty 1

## 2020-04-26 MED ORDER — PROMETHAZINE HCL 25 MG/ML IJ SOLN: 6.2500 mg | INTRAMUSCULAR | Status: DC | PRN

## 2020-04-26 MED ORDER — ALBUTEROL SULFATE HFA 108 (90 BASE) MCG/ACT IN AERS
INHALATION_SPRAY | RESPIRATORY_TRACT | Status: DC | PRN
  Administered 2020-04-26: 6 via RESPIRATORY_TRACT

## 2020-04-26 MED ORDER — OXYCODONE HCL 5 MG/5ML PO SOLN
5.0000 mg | ORAL | Status: DC | PRN
  Administered 2020-04-26: 10 mg via ORAL

## 2020-04-26 MED ORDER — METHOCARBAMOL 500 MG PO TABS
1000.0000 mg | ORAL_TABLET | Freq: Three times a day (TID) | ORAL | Status: DC
  Administered 2020-04-26 – 2020-04-29 (×10): 1000 mg via ORAL
  Filled 2020-04-26 (×10): qty 2

## 2020-04-26 MED ORDER — PHENYLEPHRINE 40 MCG/ML (10ML) SYRINGE FOR IV PUSH (FOR BLOOD PRESSURE SUPPORT)
PREFILLED_SYRINGE | INTRAVENOUS | Status: DC | PRN
  Administered 2020-04-26 (×3): 80 ug via INTRAVENOUS
  Administered 2020-04-26: 40 ug via INTRAVENOUS

## 2020-04-26 MED ORDER — DOCUSATE SODIUM 100 MG PO CAPS
100.0000 mg | ORAL_CAPSULE | Freq: Two times a day (BID) | ORAL | Status: DC
  Administered 2020-04-26 – 2020-04-29 (×7): 100 mg via ORAL
  Filled 2020-04-26 (×7): qty 1

## 2020-04-26 SURGICAL SUPPLY — 41 items
ADH SKN CLS APL DERMABOND .7 (GAUZE/BANDAGES/DRESSINGS) ×2
APL PRP STRL LF DISP 70% ISPRP (MISCELLANEOUS) ×2
BLADE CLIPPER SURG (BLADE) IMPLANT
CANISTER SUCT 3000ML PPV (MISCELLANEOUS) IMPLANT
CHLORAPREP W/TINT 26 (MISCELLANEOUS) ×4 IMPLANT
COVER SURGICAL LIGHT HANDLE (MISCELLANEOUS) ×4 IMPLANT
DERMABOND ADVANCED (GAUZE/BANDAGES/DRESSINGS) ×2
DERMABOND ADVANCED .7 DNX12 (GAUZE/BANDAGES/DRESSINGS) ×2 IMPLANT
DRAIN CHANNEL 19F RND (DRAIN) ×3 IMPLANT
DRAPE LAPAROSCOPIC ABDOMINAL (DRAPES) ×4 IMPLANT
ELECT BLADE 4.0 EZ CLEAN MEGAD (MISCELLANEOUS) ×4
ELECT CAUTERY BLADE 6.4 (BLADE) IMPLANT
ELECT REM PT RETURN 9FT ADLT (ELECTROSURGICAL) ×4
ELECTRODE BLDE 4.0 EZ CLN MEGD (MISCELLANEOUS) ×1 IMPLANT
ELECTRODE REM PT RTRN 9FT ADLT (ELECTROSURGICAL) ×2 IMPLANT
EVACUATOR SILICONE 100CC (DRAIN) ×3 IMPLANT
GAUZE 4X4 16PLY RFD (DISPOSABLE) ×4 IMPLANT
GLOVE BIO SURGEON STRL SZ 6.5 (GLOVE) ×3 IMPLANT
GLOVE BIO SURGEONS STRL SZ 6.5 (GLOVE) ×1
GLOVE BIOGEL PI IND STRL 6 (GLOVE) ×2 IMPLANT
GLOVE BIOGEL PI INDICATOR 6 (GLOVE) ×2
GOWN STRL REUS W/ TWL LRG LVL3 (GOWN DISPOSABLE) ×4 IMPLANT
GOWN STRL REUS W/TWL LRG LVL3 (GOWN DISPOSABLE) ×8
KIT BASIN OR (CUSTOM PROCEDURE TRAY) ×4 IMPLANT
KIT TURNOVER KIT B (KITS) ×4 IMPLANT
MESH VENTRALIGHT ST 6IN CRC (Mesh General) ×3 IMPLANT
NDL HYPO 25GX1X1/2 BEV (NEEDLE) ×1 IMPLANT
NEEDLE HYPO 25GX1X1/2 BEV (NEEDLE) ×4 IMPLANT
NS IRRIG 1000ML POUR BTL (IV SOLUTION) ×4 IMPLANT
PACK GENERAL/GYN (CUSTOM PROCEDURE TRAY) ×4 IMPLANT
PAD ARMBOARD 7.5X6 YLW CONV (MISCELLANEOUS) ×4 IMPLANT
PENCIL SMOKE EVACUATOR (MISCELLANEOUS) ×4 IMPLANT
SUT MNCRL AB 4-0 PS2 18 (SUTURE) ×4 IMPLANT
SUT NOVA NAB DX-16 0-1 5-0 T12 (SUTURE) ×16 IMPLANT
SUT NOVA NAB GS-21 0 18 T12 DT (SUTURE) ×4 IMPLANT
SUT SILK 2 0 TIES 10X30 (SUTURE) ×3 IMPLANT
SUT VIC AB 3-0 SH 27 (SUTURE) ×8
SUT VIC AB 3-0 SH 27X BRD (SUTURE) ×3 IMPLANT
SYR CONTROL 10ML LL (SYRINGE) ×4 IMPLANT
TOWEL GREEN STERILE (TOWEL DISPOSABLE) ×4 IMPLANT
TOWEL GREEN STERILE FF (TOWEL DISPOSABLE) ×4 IMPLANT

## 2020-04-26 NOTE — Anesthesia Procedure Notes (Signed)
Anesthesia Regional Block: TAP block   Pre-Anesthetic Checklist: ,, timeout performed, Correct Patient, Correct Site, Correct Laterality, Correct Procedure, Correct Position, site marked, Risks and benefits discussed,  Surgical consent,  Pre-op evaluation,  At surgeon's request and post-op pain management  Laterality: Left  Prep: chloraprep       Needles:  Injection technique: Single-shot  Needle Type: Echogenic Stimulator Needle     Needle Length: 10cm  Needle Gauge: 21     Additional Needles:   Procedures:,,,, ultrasound used (permanent image in chart),,,,  Narrative:  Start time: 04/26/2020 7:00 AM End time: 04/26/2020 7:05 AM Injection made incrementally with aspirations every 5 mL.  Performed by: Personally   Additional Notes: No pain on injection. No increased resistance to injection. Injection made in 5cc increments.  Good needle visualization.  Patient tolerated procedure well.

## 2020-04-26 NOTE — Anesthesia Postprocedure Evaluation (Signed)
Anesthesia Post Note  Patient: Lawyer) Performed: OPEN VENTRAL HERNIA REPAIR WITH MESH (N/A Abdomen)     Patient location during evaluation: PACU Anesthesia Type: General Level of consciousness: awake and alert Pain management: pain level controlled Vital Signs Assessment: post-procedure vital signs reviewed and stable Respiratory status: spontaneous breathing, nonlabored ventilation, respiratory function stable and patient connected to nasal cannula oxygen Cardiovascular status: blood pressure returned to baseline and stable Postop Assessment: no apparent nausea or vomiting Anesthetic complications: no   No complications documented.  Last Vitals:  Vitals:   04/26/20 1230 04/26/20 1245  BP: (!) 145/83 (!) 156/94  Pulse:    Resp: 18 (!) 22  Temp:    SpO2: 93% 93%    Last Pain:  Vitals:   04/26/20 1215  TempSrc:   PainSc: [place]

## 2020-04-26 NOTE — Anesthesia Procedure Notes (Addendum)
Procedure Name: Intubation Date/Time: 04/26/2020 7:58 AM Performed by: Catalina Gravel, MD Pre-anesthesia Checklist: Patient identified, Emergency Drugs available, Suction available and Patient being monitored Patient Re-evaluated:Patient Re-evaluated prior to induction Oxygen Delivery Method: Circle System Utilized Preoxygenation: Pre-oxygenation with 100% oxygen Induction Type: IV induction Ventilation: Mask ventilation without difficulty, Oral airway inserted - appropriate to patient size and Two handed mask ventilation required Laryngoscope Size: Mac and 4 Grade View: Grade IV Tube type: Oral Tube size: 7.5 mm Number of attempts: 2 (Attempt x1 by SRNA with no view of glottis, attempt x1 by MDA with Grade IV view, ETT placed through cords under direct visualization) Airway Equipment and Method: Stylet and Oral airway Placement Confirmation: ETT inserted through vocal cords under direct vision,  positive ETCO2 and breath sounds checked- equal and bilateral Secured at: 23 cm Tube secured with: Tape Dental Injury: Teeth and Oropharynx as per pre-operative assessment  Difficulty Due To: Difficulty was anticipated and Difficult Airway- due to large tongue Future Recommendations: Recommend- induction with short-acting agent, and alternative techniques readily available Comments: Unsuccessful attempt by SRNA x 1 with MAC 4, placed by MDA with 1 attempt (see above)

## 2020-04-26 NOTE — Op Note (Signed)
   Operative Note   Date: 04/26/2020  Procedure: open ventral hernia repair with mesh  Pre-op diagnosis: ventral hernia Post-op diagnosis: same  Indication and clinical history: The patient is a 50 y.o. year old male with a chronically incarcerated ventral hernia     Surgeon: Jesusita Oka, MD Assistant: Georgette Dover, MD  Anesthesiologist: Gifford Shave, MD Anesthesia: General  Findings:  . Specimen: hernia sac . EBL: 25cc . Drains/Implants: 6x6in ventralight mesh-underlay, JP x1, subcutaneous tissues  Disposition: PACU - guarded condition.  Description of procedure: The patient was positioned supine on the operating room table. General anesthetic induction and intubation were uneventful. Time-out was performed verifying correct patient, procedure, signature of informed consent, and administration of pre-operative antibiotics, VTE prophylaxis with heparin. The abdomen was prepped and draped in the usual sterile fashion and included the use of an ioban.   A midline incision was made and deepened until the hernia sac was reached. The hernia was exposed circumferentially until the neck of the hernia was exposed. The hernia sac was then incised, adhesiolysis performed of the hernia contents, and the hernia sac excised. The hernia defect was approximately 9cm x6cm. The anterior fascia was exposed circumferentially and a 6inx6in ventralight mesh was secured circumferentially in an underlay fashion using #1 novofil suture. The fascia was closed over the mesh using #1 novofil figure-of-eight sutures. The dead space was closed with 3-0 vicryl suture and a 41F JP placed in the subcutaneous tissues. The skin was closed with staples.   Sterile dressings were applied. All sponge and instrument counts were correct at the conclusion of the procedure. The patient was awakened from anesthesia and extubated, but had to have airway re-establishment with an LMA briefly. He was successfully extubated prior to transport  to the PACU and placed on bipap. There were no complications.    Jesusita Oka, MD General and [facility]

## 2020-04-26 NOTE — H&P (Signed)
Brent Cruz is an 50 y.o. male.   HPI: 16M with chronically incarcerated umbilical hernia. The patient has had no hospitalizations, doctors visits, ER visits, surgeries, or newly diagnosed allergies since being seen in the office.    Past Medical History:  Diagnosis Date  . Asthma    as a child  . Bell's palsy    Recurrent  . COVID-19 10/2018  . Hypertension   . Myasthenia gravis with exacerbation ([place]) 03/30/2014  . Obesity     Past Surgical History:  Procedure Laterality Date  . tonsil abscess      Family History  Problem Relation Age of Onset  . Healthy Mother   . Glaucoma Father   . Diabetes Paternal Grandfather     Social History:  reports that he quit smoking about 31 years ago. He has never used smokeless tobacco. He reports current alcohol use. He reports that he does not use drugs.  Allergies: No Known Allergies  Medications: I have reviewed the patient's current medications.  Results for orders placed or performed during the hospital encounter of 04/25/20 (from the past 48 hour(s))  SARS CORONAVIRUS 2 (TAT 6-24 HRS) Nasopharyngeal Nasopharyngeal Swab     Status: None   Collection Time: 04/25/20 12:11 PM   Specimen: Nasopharyngeal Swab  Result Value Ref Range   SARS Coronavirus 2 NEGATIVE NEGATIVE    Comment: (NOTE) SARS-CoV-2 target nucleic acids are NOT DETECTED.  The SARS-CoV-2 RNA is generally detectable in upper and lower respiratory specimens during the acute phase of infection. Negative results do not preclude SARS-CoV-2 infection, do not rule out co-infections with other pathogens, and should not be used as the sole basis for treatment or other patient management decisions. Negative results must be combined with clinical observations, patient history, and epidemiological information. The expected result is Negative.  Fact Sheet for Patients: SugarRoll.be  Fact Sheet for Healthcare  Providers: https://www.woods-mathews.com/  This test is not yet approved or cleared by the [place] FDA and  has been authorized for detection and/or diagnosis of SARS-CoV-2 by FDA under an Emergency Use Authorization (EUA). This EUA will remain  in effect (meaning this test can be used) for the duration of the COVID-19 declaration under Se ction 564(b)(1) of the Act, 21 U.S.C. section 360bbb-3(b)(1), unless the authorization is terminated or revoked sooner.  Performed at [facility], [place] [address], [place], [place] [address]     No results found.  ROS 10 point review of systems is negative except as listed above in HPI.   Physical Exam Blood pressure (!) 143/81, pulse 91, temperature 98 F (36.7 C), temperature source Oral, resp. rate 18, height 5\' 5"  (1.651 m), weight (!) 146.5 kg, SpO2 98 %. Constitutional: well-developed, well-nourished HEENT: pupils equal, round, reactive to light, 85mm b/l, moist conjunctiva, external inspection of ears and nose normal, hearing intact Oropharynx: normal oropharyngeal mucosa, normal dentition Neck: no thyromegaly, trachea midline, no midline cervical tenderness to palpation Chest: breath sounds equal bilaterally, normal respiratory effort, no midline or lateral chest wall tenderness to palpation/deformity Abdomen: soft, NT, large, chronically incarcerated umbilical hernia, no bruising, no hepatosplenomegaly GU: no blood at urethral meatus of penis, no scrotal masses or abnormality  Back: no wounds, no thoracic/lumbar spine tenderness to palpation, no thoracic/lumbar spine stepoffs Rectal: deferred Extremities: 2+ radial and pedal pulses bilaterally, motor and sensation intact to bilateral UE and LE, no peripheral edema MSK: normal gait/station, no clubbing/cyanosis of fingers/toes, normal ROM of all four extremities Skin:  warm, dry, no rashes Psych: normal memory, normal mood/affect    Assessment/Plan: 12M with  chronically incarcerated umbilical hernia. Plan for open umbilical hernia repair with mesh. Informed consent was obtained after detailed explanation of risks, including bleeding, infection, hematoma/seroma, recurrence, and mesh infection requiring explantation. All questions answered to the patient's satisfaction.   Jesusita Oka, MD General and [facility]

## 2020-04-26 NOTE — Transfer of Care (Signed)
Immediate Anesthesia Transfer of Care Note  Patient: Brent Cruz  Procedure(s) Performed: OPEN VENTRAL HERNIA REPAIR WITH MESH (N/A Abdomen)  Patient Location: PACU  Anesthesia Type:GA combined with regional for post-op pain  Level of Consciousness: awake, alert  and patient cooperative  Airway & Oxygen Therapy: Patient Spontanous Breathing and Patient connected to face mask oxygen  Post-op Assessment: Report given to RN, Post -op Vital signs reviewed and stable and Patient moving all extremities  Post vital signs: Reviewed and stable; Dr. Gifford Shave transferred patient with [place] and patient placed on CPAP on arrival to PACU.   Last Vitals:  Vitals Value Taken Time  BP 108/74 04/26/20 1100  Temp    Pulse 92 04/26/20 1102  Resp 13 04/26/20 1102  SpO2 92 % 04/26/20 1102  Vitals shown include unvalidated device data.  Last Pain:  Vitals:   04/26/20 0720  TempSrc:   PainSc: 0-No pain      Patients Stated Pain Goal: 3 (52/71/29 2909)  Complications: No complications documented.

## 2020-04-26 NOTE — Anesthesia Procedure Notes (Signed)
Anesthesia Regional Block: TAP block   Pre-Anesthetic Checklist: ,, timeout performed, Correct Patient, Correct Site, Correct Laterality, Correct Procedure, Correct Position, site marked, Risks and benefits discussed,  Surgical consent,  Pre-op evaluation,  At surgeon's request and post-op pain management  Laterality: Right  Prep: chloraprep       Needles:  Injection technique: Single-shot  Needle Type: Echogenic Stimulator Needle     Needle Length: 10cm  Needle Gauge: 21     Additional Needles:   Procedures:,,,, ultrasound used (permanent image in chart),,,,  Narrative:  Start time: 04/26/2020 7:05 AM End time: 04/26/2020 7:10 AM Injection made incrementally with aspirations every 5 mL.  Performed by: Personally   Additional Notes: No pain on injection. No increased resistance to injection. Injection made in 5cc increments.  Good needle visualization.  Patient tolerated procedure well.

## 2020-04-27 ENCOUNTER — Encounter (HOSPITAL_COMMUNITY): Payer: Self-pay | Admitting: Surgery

## 2020-04-27 ENCOUNTER — Other Ambulatory Visit: Payer: Self-pay | Admitting: Surgery

## 2020-04-27 DIAGNOSIS — Z6841 Body Mass Index (BMI) 40.0 and over, adult: Secondary | ICD-10-CM | POA: Diagnosis not present

## 2020-04-27 DIAGNOSIS — R0902 Hypoxemia: Secondary | ICD-10-CM | POA: Diagnosis present

## 2020-04-27 DIAGNOSIS — Z83511 Family history of glaucoma: Secondary | ICD-10-CM | POA: Diagnosis not present

## 2020-04-27 DIAGNOSIS — Z20822 Contact with and (suspected) exposure to covid-19: Secondary | ICD-10-CM | POA: Diagnosis present

## 2020-04-27 DIAGNOSIS — Z833 Family history of diabetes mellitus: Secondary | ICD-10-CM | POA: Diagnosis not present

## 2020-04-27 DIAGNOSIS — Z87891 Personal history of nicotine dependence: Secondary | ICD-10-CM | POA: Diagnosis not present

## 2020-04-27 DIAGNOSIS — K436 Other and unspecified ventral hernia with obstruction, without gangrene: Secondary | ICD-10-CM | POA: Diagnosis present

## 2020-04-27 DIAGNOSIS — Z8616 Personal history of COVID-19: Secondary | ICD-10-CM | POA: Diagnosis not present

## 2020-04-27 DIAGNOSIS — G7000 Myasthenia gravis without (acute) exacerbation: Secondary | ICD-10-CM | POA: Diagnosis present

## 2020-04-27 DIAGNOSIS — J45909 Unspecified asthma, uncomplicated: Secondary | ICD-10-CM | POA: Diagnosis present

## 2020-04-27 DIAGNOSIS — I10 Essential (primary) hypertension: Secondary | ICD-10-CM | POA: Diagnosis present

## 2020-04-27 LAB — SURGICAL PATHOLOGY

## 2020-04-27 MED ORDER — METHOCARBAMOL 500 MG PO TABS: 1000.0000 mg | ORAL_TABLET | Freq: Three times a day (TID) | ORAL | 1 refills | Status: DC

## 2020-04-27 MED ORDER — DOCUSATE SODIUM 100 MG PO CAPS: 100.0000 mg | ORAL_CAPSULE | Freq: Two times a day (BID) | ORAL | 2 refills | Status: DC

## 2020-04-27 MED ORDER — IBUPROFEN 800 MG PO TABS: 800.0000 mg | ORAL_TABLET | Freq: Three times a day (TID) | ORAL | 1 refills | Status: DC

## 2020-04-27 MED ORDER — OXYCODONE HCL 5 MG PO TABS: 5.0000 mg | ORAL_TABLET | Freq: Three times a day (TID) | ORAL | 0 refills | Status: DC | PRN

## 2020-04-27 NOTE — Discharge Summary (Signed)
    Patient ID: Brent Cruz [id number] Apr 03, 1970 50 y.o.  Admit date: 04/26/2020 Discharge date: 04/27/2020  Admitting Diagnosis: [facility]  Discharge Diagnosis Patient Active Problem List   Diagnosis Date Noted   H/O hernia repair 04/26/2020   At risk for acute ischemic cardiac event 06/10/2017   Myasthenia gravis with exacerbation ([place]) 03/30/2014   Essential hypertension 03/15/2014   Bell's palsy 03/04/2012    Consultants none  Reason for Admission: Elective [facility]  Procedures [facility] Course:  Uncomplicated post-operative course, stayed an extra few days due to hypoxia with ambulation in the setting of myasthenia gravis    Physical Exam: Gen: comfortable, no distress Neuro: non-focal exam HEENT: PERRL Neck: supple CV: RRR Pulm: unlabored breathing Abd: soft, appropriately TTP, incision c/d/I with staples GU: clear yellow urine Extr: wwp, no edema   Allergies as of 04/27/2020   No Known Allergies      Medication List     TAKE these medications    docusate sodium 100 MG capsule Commonly known as: COLACE Take 1 capsule (100 mg total) by mouth 2 (two) times daily.   ibuprofen 800 MG tablet Commonly known as: ADVIL Take 1 tablet (800 mg total) by mouth every 8 (eight) hours.   lisinopril 20 MG tablet Commonly known as: ZESTRIL Take 1 tablet (20 mg total) by mouth daily.   methocarbamol 500 MG tablet Commonly known as: ROBAXIN Take 2 tablets (1,000 mg total) by mouth every 8 (eight) hours.   oxyCODONE 5 MG immediate release tablet Commonly known as: Roxicodone Take 1 tablet (5 mg total) by mouth every 8 (eight) hours as needed.   predniSONE 10 MG tablet Commonly known as: DELTASONE Take 1 tablet (10 mg total) by mouth daily with breakfast.   pyridostigmine 60 MG tablet Commonly known as: Mestinon Take 0.5 tablets (30 mg total) by mouth 4 (four) times daily.   Vitamin D (Ergocalciferol) 1.25 MG (50000 UNIT) Caps capsule Commonly known  as: DRISDOL Take 50,000 Units by mouth once a week.             Signed: Jesusita Oka, [facility] 04/27/2020, 12:00 PM

## 2020-04-27 NOTE — Progress Notes (Signed)
Patient watched this nurse as I emptied the JP drain. Able to teach back. Will have the patient empty the drain when it is due to be done again. Patient comfortable with drain care.

## 2020-04-27 NOTE — Evaluation (Signed)
Physical Therapy Evaluation Patient Details Name: Brent Cruz MRN: [id number] DOB: 14-Jan-1971 Today's Date: 04/27/2020   History of Present Illness  50 yo male with onset of incarcerated ventral umbilical hernia was admitted for ventral mesh hernia repair. PMHx:  umbilical hernia, asthma, Bell's palsy, Covid 19, HTN, myasthenia gravis, morbid obesity  Clinical Impression  Pt was seen for mobility on hallway with room air and noted his SOB and sudden diaphoresis, sat him down when sat was measured at 70% unexpectedly.  Shared with MD and nursing, and pt was able to recover and walk back to room with sats holding at 94%.  Follow along with him to recover endurance and balance safety, and will recommend HHPT for his sats that are in need of follow up to be sure he is tolerant of moving.  Pt has new spirometer to assist oxygenation, and will follow for goals of acute PT.    Follow Up Recommendations Home health PT;Supervision for mobility/OOB    Equipment Recommendations  None recommended by PT    Recommendations for Other Services       Precautions / Restrictions Precautions Precautions: None Precaution Comments: abdominal incision Required Braces or Orthoses: Other Brace (abdominal binder) Restrictions Weight Bearing Restrictions: No Other Position/Activity Restrictions: body mechanics for safety of abd incision      Mobility  Bed Mobility Overal bed mobility: Modified Independent             General bed mobility comments: used bed rail for support to stadn    Transfers Overall transfer level: Modified independent Equipment used: None             General transfer comment: min guard used initially until PT could see pt behaving safely and independently  Ambulation/Gait Ambulation/Gait assistance: Min guard Gait Distance (Feet): 200 Feet (160+40) Assistive device: 1 person hand held assist (min guard) Gait Pattern/deviations: Step-through pattern;Wide base of  support Gait velocity: reduced Gait velocity interpretation: <1.31 ft/sec, indicative of household ambulator General Gait Details: pt became SOB and checked O2 sats, resulting in values of 70% sat on room air and pulse 132.  Rested and recovered to 96%, was 94% returning to room  Stairs            Wheelchair Mobility    Modified Rankin (Stroke Patients Only)       Balance Overall balance assessment: Needs assistance Sitting-balance support: Feet supported Sitting balance-Leahy Scale: Good     Standing balance support: No upper extremity supported Standing balance-Leahy Scale: Fair                               Pertinent Vitals/Pain Pain Assessment: Faces Pain Score: 3  Faces Pain Scale: Hurts little more Pain Location: surgery site Pain Descriptors / Indicators: Operative site guarding;Guarding Pain Intervention(s): Monitored during session;Repositioned    Home Living Family/patient expects to be discharged to:: Private residence Living Arrangements: Parent;Other relatives Available Help at Discharge: Family;Available 24 hours/day Type of Home: House Home Access: Stairs to enter Entrance Stairs-Rails: None Entrance Stairs-Number of Steps: 2 Home Layout: One level Home Equipment: None Additional Comments: pt is able to stand from bed without help and is walking with no AD    Prior Function Level of Independence: Independent         Comments: no falls, able to walk without help     Hand Dominance   Dominant Hand: Right    Extremity/Trunk Assessment  Upper Extremity Assessment Upper Extremity Assessment: Overall WFL for tasks assessed    Lower Extremity Assessment Lower Extremity Assessment: Defer to PT evaluation    Cervical / Trunk Assessment Cervical / Trunk Assessment: Normal  Communication   Communication: No difficulties  Cognition Arousal/Alertness: Awake/alert Behavior During Therapy: WFL for tasks  assessed/performed Overall Cognitive Status: Within Functional Limits for tasks assessed                                 General Comments: reminded pt if he is feeling bad when walking to let staff know      General Comments General comments (skin integrity, edema, etc.): pt was assisted with min guard to walk and rested when O2 sats drop.  Would recommend another walk to recheck    Exercises     Assessment/Plan    PT Assessment Patient needs continued PT services  PT Problem List Decreased activity tolerance;Decreased balance;Cardiopulmonary status limiting activity       PT Treatment Interventions DME instruction;Gait training;Stair training;Therapeutic activities;Functional mobility training;Therapeutic exercise;Balance training;Neuromuscular re-education;Patient/family education    PT Goals (Current goals can be found in the Care Plan section)  Acute Rehab PT Goals Patient Stated Goal: to get home and feel better PT Goal Formulation: With patient Time For Goal Achievement: 05/04/20 Potential to Achieve Goals: Good    Frequency Min 3X/week   Barriers to discharge   home with family    Co-evaluation               AM-PAC PT "6 Clicks" Mobility  Outcome Measure Help needed turning from your back to your side while in a flat bed without using bedrails?: None Help needed moving from lying on your back to sitting on the side of a flat bed without using bedrails?: None Help needed moving to and from a bed to a chair (including a wheelchair)?: A Little Help needed standing up from a chair using your arms (e.g., wheelchair or bedside chair)?: A Little Help needed to walk in hospital room?: A Little Help needed climbing 3-5 steps with a railing? : A Little 6 Click Score: 20    End of Session Equipment Utilized During Treatment: Gait belt Activity Tolerance: Patient tolerated treatment well;Treatment limited secondary to medical complications  (Comment) Patient left: in chair;with call bell/phone within reach;with chair alarm set Nurse Communication: Mobility status PT Visit Diagnosis: Unsteadiness on feet (R26.81);Difficulty in walking, not elsewhere classified (R26.2);Pain Pain - Right/Left:  (abdomen) Pain - part of body:  (abdomen)    Time: 8003-4917 PT Time Calculation (min) (ACUTE ONLY): 39 min   Charges:   PT Evaluation $PT Eval Moderate Complexity: 1 Mod PT Treatments $Gait Training: 8-22 mins $Therapeutic Activity: 8-22 mins       Ramond Dial 04/27/2020, 4:30 PM  Mee Hives, PT MS Acute Rehab Dept. Number: [place] [facility] [place]

## 2020-04-27 NOTE — Progress Notes (Signed)
Pt was independent prior to admission. Abdominal pain and binder interfering with his ability to perform pericare. Issued and instructed in use of toilet tongs. Recommended long handled bath sponge for feet when allowed to shower. Pt plans to wear slip on shoes and avoid socks. No further OT needs.   04/27/20 1500  OT Visit Information  Last OT Received On 04/27/20  Assistance Needed +1  History of Present Illness 50 yo male with onset of incarcerated ventral umbilical hernia was admitted for ventral mesh hernia repair. PMHx:  umbilical hernia, asthma, Bell's palsy, Covid 19, HTN, myasthenia gravis, morbid obesity  Precautions  Precautions None  Precaution Comments abdominal incision  Required Braces or Orthoses Other Brace (abdominal binder)  Home Living  Family/patient expects to be discharged to: Private residence  Living Arrangements Parent;Other relatives  Available Help at Discharge Family;Available 24 hours/day  Type of Home House  Home Access Stairs to enter  Entrance Stairs-Number of Steps 2  Entrance Stairs-Rails None  Home Layout One level  Bathroom Therapist, music None  Prior Function  Level of Independence Independent  Communication  Communication No difficulties  Pain Assessment  Pain Assessment Faces  Faces Pain Scale 4  Pain Location surgery site  Pain Descriptors / Indicators Operative site guarding;Guarding  Pain Intervention(s) Monitored during session;Repositioned  Cognition  Arousal/Alertness Awake/alert  Behavior During Therapy WFL for tasks assessed/performed  Overall Cognitive Status Within Functional Limits for tasks assessed  Upper Extremity Assessment  Upper Extremity Assessment Overall WFL for tasks assessed  Lower Extremity Assessment  Lower Extremity Assessment Defer to PT evaluation  Cervical / Trunk Assessment  Cervical / Trunk Assessment Normal  ADL  Overall ADL's  Modified independent   General ADL Comments Issued toileting tongs and recommended long handled sponge when pt is allowed to shower. Pt states he plans to wear slip on shoes.  Vision- History  Patient Visual Report No change from baseline  Bed Mobility  Overal bed mobility Modified Independent  Transfers  Overall transfer level Modified independent  Equipment used None  OT - End of Session  Activity Tolerance Patient tolerated treatment well  Patient left in chair;with call bell/phone within reach  OT Assessment  OT Recommendation/Assessment Patient does not need any further OT services  OT Visit Diagnosis Pain  AM-PAC OT "6 Clicks" Daily Activity Outcome Measure (Version 2)  Help from another person eating meals? 4  Help from another person taking care of personal grooming? 4  Help from another person toileting, which includes using toliet, bedpan, or urinal? 4  Help from another person bathing (including washing, rinsing, drying)? 4  Help from another person to put on and taking off regular upper body clothing? 4  Help from another person to put on and taking off regular lower body clothing? 4  6 Click Score 24  OT Recommendation  Follow Up Recommendations No OT follow up  OT Equipment None recommended by OT  OT Time Calculation  OT Start Time (ACUTE ONLY) 1415  OT Stop Time (ACUTE ONLY) 1439  OT Time Calculation (min) 24 min  OT General Charges  $OT Visit 1 Visit  OT Evaluation  $OT Eval Low Complexity 1 Low  Written Expression  Dominant Hand Right  Nestor Lewandowsky, OTR/L Acute Rehabilitation Services Pager: [phone] Office: [phone]

## 2020-04-28 ENCOUNTER — Other Ambulatory Visit: Payer: Self-pay | Admitting: Surgery

## 2020-04-28 MED ORDER — MENTHOL 3 MG MT LOZG: 1.0000 | LOZENGE | OROMUCOSAL | Status: DC | PRN

## 2020-04-28 MED ORDER — PHENOL 1.4 % MT LIQD: 1.0000 | OROMUCOSAL | Status: DC | PRN

## 2020-04-28 MED ORDER — OXYCODONE HCL 5 MG PO TABS: 5.0000 mg | ORAL_TABLET | ORAL | 0 refills | Status: DC | PRN

## 2020-04-28 NOTE — TOC Initial Note (Signed)
Transition of Care [facility]) - Initial/Assessment Note    Patient Details  Name: Brent Cruz MRN: [id number] Date of Birth: [birth year]  Transition of Care [facility]) CM/SW Contact:    Marilu Favre, RN Phone Number: 04/28/2020, 4:58 PM  Clinical Narrative:                 Patient from home. Confirmed face sheet information.   PCP Dr Nolene Ebbs.   Discussed HHPT. Patient lives with his father who prefers not to have people in their home ( due to covid etc) . Patient prefers OP PT over HHPT. Provided list of OP PT Centers chose [facility]. Entered order and secure chatted MD.   Will continue to follow for possible oxygen needs. Will need oxygen saturation ambulatory note. Explained to patient if needed [employer] will deliver oxygen to room prior to discharge.   Patient voiced understanding.  Expected Discharge Plan: [place]     Patient Goals and CMS Choice Patient states their goals for this hospitalization and ongoing recovery are:: to return to home CMS Medicare.gov Compare Post Acute Care list provided to:: Patient Choice offered to / list presented to : Patient  Expected Discharge Plan and Services Expected Discharge Plan: [place]   Discharge Planning Services: CM Consult Post Acute Care Choice: [place] arrangements for the past 2 months: Single Family Home Expected Discharge Date: 04/28/20               DME Arranged: N/A         HH Arranged: PT,Patient Refused HH          Prior Living Arrangements/Services Living arrangements for the past 2 months: Single Family Home Lives with:: Parents Patient language and need for interpreter reviewed:: Yes Do you feel safe going back to the place where you live?: Yes      Need for Family Participation in Patient Care: Yes (Comment) Care giver support system in place?: Yes (comment)   Criminal Activity/Legal Involvement Pertinent to Current  Situation/Hospitalization: No - Comment as needed  Activities of Daily Living Home Assistive Devices/Equipment: None ADL Screening (condition at time of admission) Patient's cognitive ability adequate to safely complete daily activities?: Yes Is the patient deaf or have difficulty hearing?: No Does the patient have difficulty seeing, even when wearing glasses/contacts?: No Does the patient have difficulty concentrating, remembering, or making decisions?: No Patient able to express need for assistance with ADLs?: Yes Does the patient have difficulty dressing or bathing?: No Independently performs ADLs?: Yes (appropriate for developmental age) Does the patient have difficulty walking or climbing stairs?: No Weakness of Legs: None Weakness of Arms/Hands: None  Permission Sought/Granted   Permission granted to share information with : No              Emotional Assessment Appearance:: Appears stated age Attitude/Demeanor/Rapport: Engaged Affect (typically observed): Accepting Orientation: : Oriented to Place,Oriented to Self,Oriented to  Time,Oriented to Situation Alcohol / Substance Use: Not Applicable Psych Involvement: No (comment)  Admission diagnosis:  H/O hernia repair [P61.950, Z87.19] Patient Active Problem List   Diagnosis Date Noted  . H/O hernia repair 04/26/2020  . At risk for acute ischemic cardiac event 06/10/2017  . Myasthenia gravis with exacerbation ([place]) 03/30/2014  . Essential hypertension 03/15/2014  . Bell's palsy 03/04/2012   PCP:  Nolene Ebbs, MD Pharmacy:   [facility] ([facility] [place] [address]  [place]) [place] [address] Phone: [phone] Fax: [phone]  [facility] [place] [place] - [address] [place] [address] Phone: [phone] Fax: [phone]     Social Determinants of Health (SDOH) Interventions    Readmission Risk  Interventions No flowsheet data found.

## 2020-04-28 NOTE — Plan of Care (Signed)
  Problem: Education: Goal: Knowledge of General Education information will improve Description Including pain rating scale, medication(s)/side effects and non-pharmacologic comfort measures Outcome: Progressing   

## 2020-04-28 NOTE — Progress Notes (Signed)
Physical Therapy Treatment Patient Details Name: Brent Cruz MRN: [id number] DOB: Dec 01, 1970 Today's Date: 04/28/2020    History of Present Illness 50 yo male with onset of incarcerated ventral umbilical hernia was admitted for ventral mesh hernia repair. PMHx:  umbilical hernia, asthma, Bell's palsy, Covid 19, HTN, myasthenia gravis, morbid obesity    PT Comments    Pt was seen for progression to walk with and without O2.  Pt is unable to maintain sats on room air although he can sit at rest and have sats at a respectable level.  Continue to encourage mobility with tank and with nursing to supervise his tolerances.  PT will follow along with him during the rest of this stay.  Encourage him to avoid excess conversation with gait as well.  Follow Up Recommendations  Home health PT;Supervision for mobility/OOB     Equipment Recommendations  None recommended by PT    Recommendations for Other Services       Precautions / Restrictions Precautions Precautions: Other (comment) (monitor O2 sats wtih gait) Precaution Comments: abdominal incision Required Braces or Orthoses: Other Brace Other Brace: abd binder when up Restrictions Weight Bearing Restrictions: No Other Position/Activity Restrictions: body mechanics for safety of abd incision    Mobility  Bed Mobility Overal bed mobility: Modified Independent             General bed mobility comments: used bed rail for support to stadn    Transfers                    Ambulation/Gait Ambulation/Gait assistance: Supervision Gait Distance (Feet): 150 Feet (75 x 2) Assistive device: None Gait Pattern/deviations: Step-through pattern;Wide base of support;Trunk flexed Gait velocity: controlled Gait velocity interpretation: <1.31 ft/sec, indicative of household ambulator General Gait Details: reviewed O2 sats with and without O2, was down to 83% without supplementing but 96% with O2   Stairs              Wheelchair Mobility    Modified Rankin (Stroke Patients Only)       Balance Overall balance assessment: Needs assistance Sitting-balance support: Feet supported Sitting balance-Leahy Scale: Good       Standing balance-Leahy Scale: Fair                              Cognition Arousal/Alertness: Awake/alert Behavior During Therapy: WFL for tasks assessed/performed Overall Cognitive Status: Within Functional Limits for tasks assessed                                 General Comments: told pt that he is good with O2 sats during walk with 2L      Exercises      General Comments General comments (skin integrity, edema, etc.): pt rolled the tank himself to walk to demonstrate independent mobility      Pertinent Vitals/Pain Pain Assessment: Faces Faces Pain Scale: Hurts a little bit Pain Location: surgery site Pain Descriptors / Indicators: Operative site guarding;Guarding Pain Intervention(s): Monitored during session;Repositioned    Home Living                      Prior Function            PT Goals (current goals can now be found in the care plan section) Acute Rehab PT Goals Patient Stated Goal: to get home and  feel better Progress towards PT goals: Progressing toward goals    Frequency    Min 3X/week      PT Plan Current plan remains appropriate    Co-evaluation              AM-PAC PT "6 Clicks" Mobility   Outcome Measure  Help needed turning from your back to your side while in a flat bed without using bedrails?: None Help needed moving from lying on your back to sitting on the side of a flat bed without using bedrails?: None Help needed moving to and from a bed to a chair (including a wheelchair)?: None Help needed standing up from a chair using your arms (e.g., wheelchair or bedside chair)?: None Help needed to walk in hospital room?: A Little Help needed climbing 3-5 steps with a railing? : A  Little 6 Click Score: 22    End of Session Equipment Utilized During Treatment: Gait belt;Oxygen Activity Tolerance: Patient tolerated treatment well;Treatment limited secondary to medical complications (Comment) Patient left: in chair;with call bell/phone within reach Nurse Communication: Mobility status;Other (comment) (o2 sats to MD) PT Visit Diagnosis: Unsteadiness on feet (R26.81);Difficulty in walking, not elsewhere classified (R26.2);Pain Pain - Right/Left:  (abdomen) Pain - part of body:  (abdomen)     Time: 6761-9509 PT Time Calculation (min) (ACUTE ONLY): 24 min  Charges:  $Gait Training: 8-22 mins $Therapeutic Activity: 8-22 mins                Ramond Dial 04/28/2020, 1:25 PM  Mee Hives, PT MS Acute Rehab Dept. Number: [place]

## 2020-04-29 NOTE — Progress Notes (Signed)
Physical Therapy Treatment Patient Details Name: Brent Cruz MRN: [id number] DOB: 1970-04-17 Today's Date: 04/29/2020    History of Present Illness 50 yo male with onset of incarcerated ventral umbilical hernia was admitted for ventral mesh hernia repair. PMHx:  umbilical hernia, asthma, Bell's palsy, Covid 19, HTN, myasthenia gravis, morbid obesity    PT Comments    Patient progressing with mobility and this date without issues with SpO2 maintaining 93% or higher while ambulatory.  Do not feel he will need initial PT follow up, but may later when recovered from surgery benefit from outpatient PT for core strengthening due to hernia surgery.  Stable for home when medically ready.  Follow Up Recommendations  No PT follow up     Equipment Recommendations  None recommended by PT    Recommendations for Other Services       Precautions / Restrictions Precautions Precaution Comments: abdominal incision & drain Required Braces or Orthoses: Other Brace Other Brace: abd binder when up    Mobility  Bed Mobility               General bed mobility comments: demonstrated technique for sit to side with pillow at abdomen for splinting and side to sit, also discussed car transfers    Transfers Overall transfer level: Modified independent Equipment used: None                Ambulation/Gait Ambulation/Gait assistance: Independent Gait Distance (Feet): 200 Feet Assistive device: None       General Gait Details: slow pace, 2 standing rest breaks, no LOB or assist needed, monitored SpO2 throughout on RW lowest was 93%.   Stairs             Wheelchair Mobility    Modified Rankin (Stroke Patients Only)       Balance Overall balance assessment: No apparent balance deficits (not formally assessed)                                          Cognition Arousal/Alertness: Awake/alert Behavior During Therapy: WFL for tasks  assessed/performed Overall Cognitive Status: Within Functional Limits for tasks assessed                                        Exercises      General Comments General comments (skin integrity, edema, etc.): completed IS x 3 up to 2070ml      Pertinent Vitals/Pain Pain Assessment: No/denies pain    Home Living                      Prior Function            PT Goals (current goals can now be found in the care plan section) Progress towards PT goals: Progressing toward goals    Frequency    Min 3X/week      PT Plan Discharge plan needs to be updated    Co-evaluation              AM-PAC PT "6 Clicks" Mobility   Outcome Measure  Help needed turning from your back to your side while in a flat bed without using bedrails?: None Help needed moving from lying on your back to sitting on the side of a flat bed without using  bedrails?: None Help needed moving to and from a bed to a chair (including a wheelchair)?: None Help needed standing up from a chair using your arms (e.g., wheelchair or bedside chair)?: None Help needed to walk in hospital room?: None Help needed climbing 3-5 steps with a railing? : A Little 6 Click Score: 23    End of Session   Activity Tolerance: Patient tolerated treatment well Patient left: in chair;with call bell/phone within reach   PT Visit Diagnosis: Unsteadiness on feet (R26.81);Difficulty in walking, not elsewhere classified (R26.2);Pain     Time: 1000-1038 PT Time Calculation (min) (ACUTE ONLY): 38 min  Charges:  $Gait Training: 8-22 mins $Self Care/Home Management: [place] MHWKG:[phone] [phone] 04/29/2020    Reginia Naas 04/29/2020, 1:31 PM

## 2020-04-29 NOTE — Plan of Care (Signed)

## 2020-06-01 ENCOUNTER — Ambulatory Visit: Payer: 59 | Admitting: Gastroenterology

## 2020-07-06 ENCOUNTER — Ambulatory Visit (INDEPENDENT_AMBULATORY_CARE_PROVIDER_SITE_OTHER): Payer: 59 | Admitting: Gastroenterology

## 2020-07-06 ENCOUNTER — Encounter: Payer: Self-pay | Admitting: Gastroenterology

## 2020-07-06 VITALS — BP 140/90 | HR 89 | Ht 66.0 in | Wt 322.0 lb

## 2020-07-06 DIAGNOSIS — Z1211 Encounter for screening for malignant neoplasm of colon: Secondary | ICD-10-CM | POA: Diagnosis not present

## 2020-07-06 DIAGNOSIS — Z8719 Personal history of other diseases of the digestive system: Secondary | ICD-10-CM

## 2020-07-06 DIAGNOSIS — Z6841 Body Mass Index (BMI) 40.0 and over, adult: Secondary | ICD-10-CM

## 2020-07-06 DIAGNOSIS — Z9889 Other specified postprocedural states: Secondary | ICD-10-CM | POA: Diagnosis not present

## 2020-07-06 MED ORDER — SUPREP BOWEL PREP KIT 17.5-3.13-1.6 GM/177ML PO SOLN: 1.0000 | ORAL | 0 refills | Status: DC

## 2020-07-06 NOTE — Patient Instructions (Signed)
You have been scheduled for a colonoscopy. Please follow written instructions given to you at your visit today.  Please pick up your prep supplies at the pharmacy within the next 1-3 days. If you use inhalers (even only as needed), please bring them with you on the day of your procedure.  Due to recent changes in healthcare laws, you may see the results of your imaging and laboratory studies on MyChart before your provider has had a chance to review them.  We understand that in some cases there may be results that are confusing or concerning to you. Not all laboratory results come back in the same time frame and the provider may be waiting for multiple results in order to interpret others.  Please give [place] 48 hours in order for your provider to thoroughly review all the results before contacting the office for clarification of your results.   If you are age 39 or younger, your body mass index should be between 19-25. Your Body mass index is 51.97 kg/m. If this is out of the aformentioned range listed, please consider follow up with your Primary Care Provider.   The [facility] providers would like to encourage you to use [facility] to communicate with providers for non-urgent requests or questions.  Due to long hold times on the telephone, sending your provider a message by [facility] may be a faster and more efficient way to get a response.  Please allow 48 business hours for a response.  Please remember that this is for non-urgent requests.   Thank you for choosing me and [facility].  Dr. Rush Landmark

## 2020-07-07 ENCOUNTER — Encounter: Payer: Self-pay | Admitting: Gastroenterology

## 2020-07-07 DIAGNOSIS — Z8371 Family history of colonic polyps: Secondary | ICD-10-CM | POA: Insufficient documentation

## 2020-07-07 DIAGNOSIS — Z9889 Other specified postprocedural states: Secondary | ICD-10-CM | POA: Insufficient documentation

## 2020-07-07 DIAGNOSIS — Z8719 Personal history of other diseases of the digestive system: Secondary | ICD-10-CM | POA: Insufficient documentation

## 2020-07-07 DIAGNOSIS — Z83719 Family history of colon polyps, unspecified: Secondary | ICD-10-CM | POA: Insufficient documentation

## 2020-07-07 DIAGNOSIS — Z1211 Encounter for screening for malignant neoplasm of colon: Secondary | ICD-10-CM | POA: Insufficient documentation

## 2020-07-07 NOTE — Progress Notes (Signed)
[place] VISIT   Primary Care Provider Nolene Ebbs, MD [address] [place] [address] [phone]  Referring Provider Jesusita Oka, MD [address] [place] [address] [phone]  Patient Profile: Brent Cruz is a 50 y.o. male with a pmh significant for myasthenia gravis, asthma, hypertension, obesity, status post ventral hernia repair.  The patient presents to the [facility] for an evaluation and management of problem(s) noted below:  Problem List 1. Colon cancer screening   2. History of ventral hernia repair   3. Class 3 severe obesity without serious comorbidity with body mass index (BMI) of 50.0 to 59.9 in adult, unspecified obesity type ([place])     History of Present Illness This is the patient's first visit to the outpatient [facility] clinic.  The patient presents for further discussion of beginning colon cancer screening.  He wants to stay as healthy as possible moving forward.  He has had a recent ventral hernia repair which has significantly improved the quality of his life for which she is very appreciative of Dr. Bobbye Morton.  The patient describes no significant alteration in his bowel habits.  He has a bowel movement once or twice daily.  No blood in his stools.  He will occasionally have abdominal upset but that is very infrequent and mostly related to something that he may have eaten.  He has had a good amount of weight loss since his surgery as he is made a lifestyle modification of increased walking on a daily basis at his work/job.  Patient has never had an upper or lower endoscopy.  There is no family history of GI malignancies.  Patient does not take significant nonsteroidals or BC/Goody powders.  GI Review of Systems Positive as above Negative for pyrosis, dysphagia, odynophagia, nausea, vomiting, pain, decreased appetite, melena, hematochezia  Review of Systems General: Positive for  intentional weight loss; denies fevers/chills HEENT: Denies oral lesions Cardiovascular: Denies chest pain/palpitations Pulmonary: Denies shortness of breath Gastroenterological: See HPI Genitourinary: Positive for a history years ago of a potential bladder stone for which she had hematuria but has not had issues since then and his PCP is aware; denies darkened urine Hematological: Denies easy bruising/bleeding Endocrine: Denies temperature intolerance Dermatological: Denies jaundice Psychological: Mood is stable   Medications Current Outpatient Medications  Medication Sig Dispense Refill  . lisinopril (ZESTRIL) 20 MG tablet Take 1 tablet (20 mg total) by mouth daily. 30 tablet 3  . Na Sulfate-K Sulfate-Mg Sulf (SUPREP BOWEL PREP KIT) 17.5-3.13-1.6 GM/177ML SOLN Take 1 kit by mouth as directed. For colonoscopy prep 354 mL 0  . predniSONE (DELTASONE) 10 MG tablet Take 1 tablet (10 mg total) by mouth daily with breakfast. 90 tablet 1  . pyridostigmine (MESTINON) 60 MG tablet Take 0.5 tablets (30 mg total) by mouth 4 (four) times daily. 60 tablet 5  . Vitamin D, Ergocalciferol, (DRISDOL) 1.25 MG (50000 UNIT) CAPS capsule Take 50,000 Units by mouth once a week.     No current facility-administered medications for this visit.    Allergies No Known Allergies  Histories Past Medical History:  Diagnosis Date  . Asthma    as a child  . Bell's palsy    Recurrent  . COVID-19 10/2018  . Hypertension   . Myasthenia gravis with exacerbation ([place]) 03/30/2014  . Obesity    Past Surgical History:  Procedure Laterality Date  .  OPEN VENTRAL HERNIA REPAIR WITH MESH (N/A Abdomen)  04/26/2020  .  tonsil abscess    . VENTRAL HERNIA REPAIR N/A 04/26/2020   Procedure: OPEN VENTRAL HERNIA REPAIR WITH MESH;  Surgeon: Jesusita Oka, MD;  Location: [facility] OR;  Service: General;  Laterality: N/A;   Social History   Socioeconomic History  . Marital status: Single    Spouse name: Not on file  .  Number of children: 2  . Years of education: College  . Highest education level: Not on file  Occupational History  . Occupation: Driver [employer]  Tobacco Use  . Smoking status: Former Smoker    Quit date: 02/12/1989    Years since quitting: 31.4  . Smokeless tobacco: Never Used  Vaping Use  . Vaping Use: Never used  Substance and Sexual Activity  . Alcohol use: Yes    Comment: Consumes alcohol on occasion  . Drug use: No  . Sexual activity: Not on file  Other Topics Concern  . Not on file  Social History Narrative   Patient is right handed.   Patient does not drink caffeine.   Social Determinants of Health   Financial Resource Strain: Not on file  Food Insecurity: Not on file  Transportation Needs: Not on file  Physical Activity: Not on file  Stress: Not on file  Social Connections: Not on file  Intimate Partner Violence: Not on file   Family History  Problem Relation Age of Onset  . Healthy Mother   . Glaucoma Father   . Diabetes Paternal Grandfather   . Colon cancer Neg Hx   . Esophageal cancer Neg Hx   . Inflammatory bowel disease Neg Hx   . Liver disease Neg Hx   . Pancreatic cancer Neg Hx   . Rectal cancer Neg Hx   . Stomach cancer Neg Hx    I have reviewed his medical, social, and family history in detail and updated the electronic medical record as necessary.    PHYSICAL EXAMINATION  BP 140/90   Pulse 89   Ht _0  (1.676 m)   Wt (!) 322 lb (146.1 kg)   BMI 51.97 kg/m  Wt Readings from Last 3 Encounters:  07/06/20 (!) 322 lb (146.1 kg)  04/26/20 (!) 322 lb 14.4 oz (146.5 kg)  09/01/19 (!) 335 lb (152 kg)  GEN: NAD, appears stated age, doesn't appear chronically ill PSYCH: Cooperative, without pressured speech EYE: Conjunctivae pink, sclerae anicteric ENT: Masked NECK: Supple CV: Nontachycardic RESP: No audible wheezing GI: NABS, protuberant abdomen, slight ventral hernia present, surgical scars well-healed, no rebound or guarding, unable to  appreciate hepatosplenomegaly due to body habitus  MSK/EXT: Trace bilateral lower extremity edema SKIN: No jaundice NEURO:  Alert & Oriented x 3, no focal deficits   REVIEW OF DATA  I reviewed the following data at the time of this encounter:  GI Procedures and Studies  No relevant studies to review  Laboratory Studies  Reviewed those in epic  Imaging Studies  2012 CT abdomen pelvis IMPRESSION:  1. No urinary tract calculi or obstruction.  2. Small anterior abdominal wall hernia as described above. It  appears uncomplicated.  3. No other acute or significant findings.    ASSESSMENT  Mr. Schaumburg is a 50 y.o. male with a pmh significant for myasthenia gravis, asthma, hypertension, obesity, status post ventral hernia repair.  The patient is seen today for evaluation and management of:  1. Colon cancer screening   2. History of ventral hernia repair   3. Class 3 severe obesity without serious comorbidity with body  mass index (BMI) of 50.0 to 59.9 in adult, unspecified obesity type [facility])    The patient is clinically and hemodynamically stable.  He has not undergone colon cancer screening in the past.  Today we had an extensive discussion about different modalities for colon cancer screening including stool based testing versus virtual colonoscopy testing versus colonoscopy.  We discussed how colonoscopy has both a screening and preventative means.  After our discussion the patient has agreed to move forward with colonoscopy as his preferred method for colon cancer screening.  Due to his history of myasthenia gravis and his body habitus/BMI his procedure will need to be performed in the hospital-based setting.  Thankfully, after his recent ventral hernia repair I suspect a colonoscopy will hopefully be easier than it would have been.  The risks and benefits of endoscopic evaluation were discussed with the patient; these include but are not limited to the risk of perforation, infection,  bleeding, missed lesions, lack of diagnosis, severe illness requiring hospitalization, as well as anesthesia and sedation related illnesses.  The patient is agreeable to proceed.  All patient questions were answered to the best of my ability, and the patient agrees to the aforementioned plan of action with follow-up as indicated.   PLAN  Proceed with scheduling colonoscopy in hospital-based setting Further follow-up to be determined after procedure   Orders Placed This Encounter  Procedures  . Procedural/ Surgical Case Request: COLONOSCOPY WITH PROPOFOL  . Ambulatory referral to Gastroenterology    New Prescriptions   NA SULFATE-K SULFATE-MG SULF (SUPREP BOWEL PREP KIT) 17.5-3.13-1.6 GM/177ML SOLN    Take 1 kit by mouth as directed. For colonoscopy prep   Modified Medications   No medications on file    Planned Follow Up No follow-ups on file.   Total Time in Face-to-Face and in Coordination of Care for patient including independent/personal interpretation/review of prior testing, medical history, examination, medication adjustment, communicating results with the patient directly, and documentation with the EHR is 35 minutes.   Justice Britain, MD [facility] Advanced Endoscopy Office # [phone]

## 2020-07-13 ENCOUNTER — Other Ambulatory Visit: Payer: Self-pay | Admitting: Internal Medicine

## 2020-07-14 LAB — LIPID PANEL
Cholesterol: 265 mg/dL — ABNORMAL HIGH (ref <200)
HDL: 40 mg/dL (ref >=40)
LDL Cholesterol (Calc): 202 mg/dL (calc) — ABNORMAL HIGH
Non-HDL Cholesterol (Calc): 225 mg/dL (calc) — ABNORMAL HIGH (ref <130)
Total CHOL/HDL Ratio: 6.6 (calc) — ABNORMAL HIGH (ref <5.0)
Triglycerides: 102 mg/dL (ref <150)

## 2020-07-14 LAB — EXTRA LAV TOP TUBE

## 2020-09-07 ENCOUNTER — Other Ambulatory Visit: Payer: Self-pay

## 2020-09-12 ENCOUNTER — Ambulatory Visit (HOSPITAL_COMMUNITY)
Admission: RE | Admit: 2020-09-12 | Discharge: 2020-09-12 | Disposition: A | Discharge status: Home / Self Care | Payer: 59 | Attending: Gastroenterology | Admitting: Gastroenterology

## 2020-09-12 ENCOUNTER — Ambulatory Visit (HOSPITAL_COMMUNITY): Payer: 59 | Admitting: Certified Registered"

## 2020-09-12 ENCOUNTER — Encounter (HOSPITAL_COMMUNITY)
Admission: RE | Disposition: A | Discharge status: Home / Self Care | Payer: Self-pay | Source: Home / Self Care | Attending: Gastroenterology

## 2020-09-12 ENCOUNTER — Encounter (HOSPITAL_COMMUNITY): Payer: Self-pay | Admitting: Gastroenterology

## 2020-09-12 ENCOUNTER — Other Ambulatory Visit: Payer: Self-pay

## 2020-09-12 DIAGNOSIS — K644 Residual hemorrhoidal skin tags: Secondary | ICD-10-CM | POA: Insufficient documentation

## 2020-09-12 DIAGNOSIS — Z87891 Personal history of nicotine dependence: Secondary | ICD-10-CM | POA: Insufficient documentation

## 2020-09-12 DIAGNOSIS — Z79899 Other long term (current) drug therapy: Secondary | ICD-10-CM | POA: Diagnosis not present

## 2020-09-12 DIAGNOSIS — Z1211 Encounter for screening for malignant neoplasm of colon: Secondary | ICD-10-CM | POA: Insufficient documentation

## 2020-09-12 DIAGNOSIS — Z8616 Personal history of COVID-19: Secondary | ICD-10-CM | POA: Diagnosis not present

## 2020-09-12 DIAGNOSIS — K641 Second degree hemorrhoids: Secondary | ICD-10-CM | POA: Insufficient documentation

## 2020-09-12 DIAGNOSIS — K635 Polyp of colon: Secondary | ICD-10-CM | POA: Insufficient documentation

## 2020-09-12 DIAGNOSIS — K5730 Diverticulosis of large intestine without perforation or abscess without bleeding: Secondary | ICD-10-CM | POA: Insufficient documentation

## 2020-09-12 HISTORY — PX: COLONOSCOPY WITH PROPOFOL: SHX5780

## 2020-09-12 HISTORY — PX: POLYPECTOMY: SHX5525

## 2020-09-12 SURGERY — COLONOSCOPY WITH PROPOFOL
Anesthesia: General

## 2020-09-12 MED ORDER — PROPOFOL 1000 MG/100ML IV EMUL
INTRAVENOUS | Status: AC
  Filled 2020-09-12: qty 100

## 2020-09-12 MED ORDER — SODIUM CHLORIDE 0.9 % IV SOLN: INTRAVENOUS | Status: DC

## 2020-09-12 MED ORDER — PHENYLEPHRINE HCL (PRESSORS) 10 MG/ML IV SOLN
INTRAVENOUS | Status: AC
  Filled 2020-09-12: qty 1

## 2020-09-12 MED ORDER — HYDRALAZINE HCL 20 MG/ML IJ SOLN
INTRAMUSCULAR | Status: AC
  Filled 2020-09-12: qty 1

## 2020-09-12 MED ORDER — LIDOCAINE 2% (20 MG/ML) 5 ML SYRINGE
INTRAMUSCULAR | Status: DC | PRN
Start: 2020-09-12 — End: 2020-09-12
  Administered 2020-09-12: 40 mg via INTRAVENOUS

## 2020-09-12 MED ORDER — LACTATED RINGERS IV SOLN: INTRAVENOUS | Status: DC | PRN

## 2020-09-12 MED ORDER — PROPOFOL 500 MG/50ML IV EMUL
INTRAVENOUS | Status: DC | PRN
  Administered 2020-09-12: 120 ug/kg/min via INTRAVENOUS

## 2020-09-12 MED ORDER — HYDRALAZINE HCL 20 MG/ML IJ SOLN
10.0000 mg | Freq: Once | INTRAMUSCULAR | Status: AC
  Administered 2020-09-12: 10 mg via INTRAVENOUS

## 2020-09-12 MED ORDER — PROPOFOL 10 MG/ML IV BOLUS
INTRAVENOUS | Status: DC | PRN
  Administered 2020-09-12: 80 mg via INTRAVENOUS

## 2020-09-12 SURGICAL SUPPLY — 22 items

## 2020-09-12 NOTE — Anesthesia Procedure Notes (Signed)
Procedure Name: MAC Date/Time: 09/12/2020 7:44 AM Performed by: Eben Burow, CRNA Pre-anesthesia Checklist: Patient identified, Emergency Drugs available, Suction available, Patient being monitored and Timeout performed Oxygen Delivery Method: Simple face mask

## 2020-09-12 NOTE — Anesthesia Procedure Notes (Deleted)
Procedure Name: MAC Date/Time: 09/12/2020 8:04 AM Performed by: Eben Burow, CRNA Pre-anesthesia Checklist: Patient identified, Emergency Drugs available, Suction available, Patient being monitored and Timeout performed Oxygen Delivery Method: Simple face mask

## 2020-09-12 NOTE — Discharge Instructions (Signed)

## 2020-09-12 NOTE — Transfer of Care (Signed)
Immediate Anesthesia Transfer of Care Note  Patient: Brent Cruz  Procedure(s) Performed: COLONOSCOPY WITH PROPOFOL POLYPECTOMY  Patient Location: PACU and Endoscopy Unit  Anesthesia Type:General  Level of Consciousness: awake, alert  and oriented  Airway & Oxygen Therapy: Patient Spontanous Breathing and Patient connected to face mask oxygen  Post-op Assessment: Report given to RN and Post -op Vital signs reviewed and stable  Post vital signs: Reviewed and stable  Last Vitals:  Vitals Value Taken Time  BP 163/93 09/12/20 0833  Temp 36.8 C 09/12/20 0833  Pulse 82 09/12/20 0836  Resp 16 09/12/20 0836  SpO2 100 % 09/12/20 0836  Vitals shown include unvalidated device data.  Last Pain:  Vitals:   09/12/20 0833  TempSrc: Oral  PainSc: 0-No pain         Complications: No notable events documented.

## 2020-09-12 NOTE — Anesthesia Procedure Notes (Addendum)
Procedure Name: LMA Insertion Date/Time: 09/12/2020 7:59 AM Performed by: Eben Burow, CRNA Pre-anesthesia Checklist: Patient identified, Emergency Drugs available, Suction available, Patient being monitored and Timeout performed Patient Re-evaluated:Patient Re-evaluated prior to induction Oxygen Delivery Method: Circle system utilized Preoxygenation: Pre-oxygenation with 100% oxygen Induction Type: IV induction Ventilation: Mask ventilation without difficulty LMA: LMA inserted LMA Size: 4.0 Tube secured with: Tape Dental Injury: Teeth and Oropharynx as per pre-operative assessment

## 2020-09-12 NOTE — H&P (Signed)
GASTROENTEROLOGY PROCEDURE H&P NOTE   Primary Care Physician: Nolene Ebbs, MD  HPI: Brent Cruz is a 50 y.o. male who presents for Colonoscopy for screening.  Past Medical History:  Diagnosis Date   Asthma    as a child   Bell's palsy    Recurrent   COVID-19 10/2018   Hypertension    Myasthenia gravis with exacerbation ([place]) 03/30/2014   Obesity    Past Surgical History:  Procedure Laterality Date    OPEN VENTRAL HERNIA REPAIR WITH MESH (N/A Abdomen)  04/26/2020   tonsil abscess     VENTRAL HERNIA REPAIR N/A 04/26/2020   Procedure: OPEN VENTRAL HERNIA REPAIR WITH MESH;  Surgeon: Jesusita Oka, MD;  Location: [facility] OR;  Service: General;  Laterality: N/A;   Current Facility-Administered Medications  Medication Dose Route Frequency Provider Last Rate Last Admin   0.9 %  sodium chloride infusion   Intravenous Continuous Mansouraty, Telford Nab., MD       Facility-Administered Medications Ordered in Other Encounters  Medication Dose Route Frequency Provider Last Rate Last Admin   lactated ringers infusion   Intravenous Continuous PRN Good, Rollene Fare, CRNA   New Bag at 09/12/20 [phone]   No Known Allergies Family History  Problem Relation Age of Onset   Healthy Mother    Glaucoma Father    Diabetes Paternal Grandfather    Colon cancer Neg Hx    Esophageal cancer Neg Hx    Inflammatory bowel disease Neg Hx    Liver disease Neg Hx    Pancreatic cancer Neg Hx    Rectal cancer Neg Hx    Stomach cancer Neg Hx    Social History   Socioeconomic History   Marital status: Single    Spouse name: Not on file   Number of children: 2   Years of education: College   Highest education level: Not on file  Occupational History   Occupation: Driver [employer]  Tobacco Use   Smoking status: Former    Types: Cigarettes    Quit date: 02/12/1989    Years since quitting: 31.6   Smokeless tobacco: Never  Vaping Use   Vaping Use: Never used  Substance and Sexual Activity   Alcohol use:  Yes    Comment: Consumes alcohol on occasion   Drug use: No   Sexual activity: Not on file  Other Topics Concern   Not on file  Social History Narrative   Patient is right handed.   Patient does not drink caffeine.   Social Determinants of Health   Financial Resource Strain: Not on file  Food Insecurity: Not on file  Transportation Needs: Not on file  Physical Activity: Not on file  Stress: Not on file  Social Connections: Not on file  Intimate Partner Violence: Not on file    Physical Exam: Vital signs in last 24 hours: Temp:  [97.3 F (36.3 C)] 97.3 F (36.3 C) (08/01 0649) Pulse Rate:  [77] 77 (08/01 0649) Resp:  [12] 12 (08/01 0649) BP: (169)/(103) 169/103 (08/01 0649) SpO2:  [100 %] 100 % (08/01 0649) Weight:  [146 kg] 146 kg (08/01 0649)   GEN: NAD EYE: Sclerae anicteric ENT: MMM CV: Non-tachycardic GI: Soft, NT/ND NEURO:  Alert & Oriented x 3  Lab Results: No results for input(s): WBC, HGB, HCT, PLT in the last 72 hours. BMET No results for input(s): NA, K, CL, CO2, GLUCOSE, BUN, CREATININE, CALCIUM in the last 72 hours. LFT No results for input(s): PROT, ALBUMIN,  AST, ALT, ALKPHOS, BILITOT, BILIDIR, IBILI in the last 72 hours. PT/INR No results for input(s): LABPROT, INR in the last 72 hours.   Impression / Plan: This is a 50 y.o.male who presents for Colonoscopy for screening.  The risks and benefits of endoscopic evaluation were discussed with the patient; these include but are not limited to the risk of perforation, infection, bleeding, missed lesions, lack of diagnosis, severe illness requiring hospitalization, as well as anesthesia and sedation related illnesses.  The patient is agreeable to proceed.    Justice Britain, MD [facility]oscopy Office # [phone]

## 2020-09-12 NOTE — Op Note (Signed)
[facility] Patient Name: Brent Cruz Procedure Date: 09/12/2020 MRN: [id number] Attending MD: Justice Britain , MD Date of Birth: 06-02-1970 CSN: [id number] Age: 50 Admit Type: Outpatient Procedure:                Colonoscopy Indications:              Screening for colorectal malignant neoplasm, This                            is the patient's first colonoscopy Providers:                Justice Britain, MD, Kary Kos RN, RN, Cherylynn Ridges, Technician, Jefm Miles CRNA Referring MD:             Tarry Kos. Avbuere MD Medicines:                Monitored Anesthesia Care Complications:            No immediate complications. Estimated Blood Loss:     Estimated blood loss was minimal. Procedure:                Pre-Anesthesia Assessment:                           - Prior to the procedure, a History and Physical                            was performed, and patient medications and                            allergies were reviewed. The patient's tolerance of                            previous anesthesia was also reviewed. The risks                            and benefits of the procedure and the sedation                            options and risks were discussed with the patient.                            All questions were answered, and informed consent                            was obtained. Prior Anticoagulants: The patient has                            taken no previous anticoagulant or antiplatelet                            agents. ASA Grade Assessment: III - A patient with  severe systemic disease. After reviewing the risks                            and benefits, the patient was deemed in                            satisfactory condition to undergo the procedure.                           After obtaining informed consent, the colonoscope                            was passed under direct vision.  Throughout the                            procedure, the patient's blood pressure, pulse, and                            oxygen saturations were monitored continuously. The                            CF-HQ190L ([id number] Olympus colonoscope was                            introduced through the anus and advanced to the 5                            cm into the ileum. The colonoscopy was performed                            without difficulty. The patient tolerated the                            procedure. The quality of the bowel preparation was                            good. The terminal ileum, ileocecal valve,                            appendiceal orifice, and rectum were photographed                            (short rectum unable to retroflex with adult                            colonoscope). Scope In: 8:01:24 AM Scope Out: 8:15:22 AM Scope Withdrawal Time: 0 hours 12 minutes 2 seconds  Total Procedure Duration: 0 hours 13 minutes 58 seconds  Findings:      The digital rectal exam findings include hemorrhoids. Pertinent       negatives include no palpable rectal lesions.      The terminal ileum and ileocecal valve appeared normal.      Two sessile polyps were found in the descending colon. The polyps were 3       to 4  mm in size. These polyps were removed with a cold snare. Resection       and retrieval were complete.      Multiple small-mouthed diverticula were found in the recto-sigmoid       colon, sigmoid colon, transverse colon and ascending colon.      Normal mucosa was found in the entire colon otherwise.      Non-bleeding non-thrombosed external and internal hemorrhoids were found       during perianal exam, during digital exam and during endoscopy. The       hemorrhoids were Grade II (internal hemorrhoids that prolapse but reduce       spontaneously). Impression:               - Hemorrhoids found on digital rectal exam.                           - The examined portion of  the ileum was normal.                           - Two 3 to 4 mm polyps in the descending colon,                            removed with a cold snare. Resected and retrieved.                           - Diverticulosis in the recto-sigmoid colon, in the                            sigmoid colon, in the transverse colon and in the                            ascending colon.                           - Normal mucosa in the entire examined colon                            otherwise.                           - Non-bleeding non-thrombosed external and internal                            hemorrhoids. Moderate Sedation:      Not Applicable - Patient had care per Anesthesia. Recommendation:           - The patient will be observed post-procedure,                            until all discharge criteria are met.                           - Discharge patient to home.                           - Patient has a contact number available for  emergencies. The signs and symptoms of potential                            delayed complications were discussed with the                            patient. Return to normal activities tomorrow.                            Written discharge instructions were provided to the                            patient.                           - High fiber diet.                           - Use FiberCon 1-2 tablets PO daily.                           - Continue present medications.                           - Await pathology results.                           - Repeat colonoscopy in 06/18/08 years for                            surveillance based on pathology results - recommend                            even if BMI improves for this to be done in                            hospital-based setting due to MG and prior history                            of difficult intubation - LMA used for today's                            procedure with good  effect.                           - The findings and recommendations were discussed                            with the patient.                           - The findings and recommendations were discussed                            with the patient's family. Procedure Code(s):        ---  Professional ---                           [phone], Colonoscopy, flexible; with removal of                            tumor(s), polyp(s), or other lesion(s) by snare                            technique Diagnosis Code(s):        --- Professional ---                           Z12.11, Encounter for screening for malignant                            neoplasm of colon                           K64.1, Second degree hemorrhoids                           K63.5, Polyp of colon                           K57.30, Diverticulosis of large intestine without                            perforation or abscess without bleeding CPT copyright 2019 [employer]. All rights reserved. The codes documented in this report are preliminary and upon coder review may  be revised to meet current compliance requirements. Justice Britain, MD 09/12/2020 8:27:34 AM Number of Addenda: 0

## 2020-09-12 NOTE — Anesthesia Preprocedure Evaluation (Signed)
Anesthesia Evaluation  Patient identified by MRN, date of birth, ID band Patient awake    Reviewed: Allergy & Precautions, NPO status , Patient's Chart, lab work & pertinent test results  History of Anesthesia Complications (+) DIFFICULT AIRWAY and history of anesthetic complications  Airway Mallampati: III  TM Distance: >3 FB Neck ROM: Full    Dental  (+) Teeth Intact,    Pulmonary neg shortness of breath, neg sleep apnea, neg COPD, neg recent URI, former smoker,  Covid-19 Nucleic Acid Test Results Lab Results      Component                Value               Date                      SARSCOV2NAA              NEGATIVE            04/25/2020                SARSCOV2NAA              DETECTED (A)        10/14/2018              breath sounds clear to auscultation       Cardiovascular hypertension, Pt. on medications (-) angina(-) Past MI and (-) CHF  Rhythm:Regular     Neuro/Psych myoasthenia gravia with eyelid symptoms  Neuromuscular disease negative psych ROS   GI/Hepatic negative GI ROS, Neg liver ROS,   Endo/Other  neg diabetesMorbid obesity  Renal/GU negative Renal ROS     Musculoskeletal negative musculoskeletal ROS (+)   Abdominal   Peds  Hematology negative hematology ROS (+)   Anesthesia Other Findings   Reproductive/Obstetrics                             Anesthesia Physical Anesthesia Plan  ASA: 3  Anesthesia Plan: MAC   Post-op Pain Management:    Induction:   PONV Risk Score and Plan: 1 and Propofol infusion  Airway Management Planned: Nasal Cannula  Additional Equipment: None  Intra-op Plan:   Post-operative Plan:   Informed Consent: I have reviewed the patients History and Physical, chart, labs and discussed the procedure including the risks, benefits and alternatives for the proposed anesthesia with the patient or authorized representative who has indicated  his/her understanding and acceptance.     Dental advisory given  Plan Discussed with: CRNA and Anesthesiologist  Anesthesia Plan Comments:         Anesthesia Quick Evaluation

## 2020-09-13 ENCOUNTER — Encounter (HOSPITAL_COMMUNITY): Payer: Self-pay | Admitting: Gastroenterology

## 2020-09-13 LAB — SURGICAL PATHOLOGY

## 2020-09-13 NOTE — Anesthesia Postprocedure Evaluation (Addendum)
Anesthesia Post Note  Patient: Lawyer) Performed: COLONOSCOPY WITH PROPOFOL POLYPECTOMY     Patient location during evaluation: PACU Anesthesia Type: General Level of consciousness: awake and patient cooperative Pain management: pain level controlled Vital Signs Assessment: post-procedure vital signs reviewed and stable Respiratory status: spontaneous breathing, nonlabored ventilation, respiratory function stable and patient connected to nasal cannula oxygen Cardiovascular status: stable and blood pressure returned to baseline Postop Assessment: no apparent nausea or vomiting Anesthetic complications: no   No notable events documented.  Last Vitals:  Vitals:   09/12/20 0900 09/12/20 0910  BP: (!) 185/106 (!) 162/98  Pulse: 73 63  Resp: 15 12  Temp:    SpO2: 97% 97%    Last Pain:  Vitals:   09/12/20 0910  TempSrc:   PainSc: 0-No pain                 Tory Septer

## 2020-09-14 ENCOUNTER — Encounter: Payer: Self-pay | Admitting: Gastroenterology

## 2021-01-11 ENCOUNTER — Ambulatory Visit (INDEPENDENT_AMBULATORY_CARE_PROVIDER_SITE_OTHER): Payer: 59 | Admitting: Diagnostic Neuroimaging

## 2021-01-11 ENCOUNTER — Encounter: Payer: Self-pay | Admitting: Diagnostic Neuroimaging

## 2021-01-11 VITALS — BP 152/92 | HR 83 | Ht 66.0 in | Wt 320.6 lb

## 2021-01-11 DIAGNOSIS — G7000 Myasthenia gravis without (acute) exacerbation: Secondary | ICD-10-CM | POA: Diagnosis not present

## 2021-01-11 MED ORDER — PYRIDOSTIGMINE BROMIDE 60 MG PO TABS: 30.0000 mg | ORAL_TABLET | Freq: Three times a day (TID) | ORAL | 12 refills | Status: DC | PRN

## 2021-01-11 NOTE — Progress Notes (Signed)
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  Patient had onset of left facial weakness around 2000-2010 (?), diagnosed with Bell's palsy.  Over time he had some recurrence of symptoms and ultimately was diagnosed with ocular myasthenia gravis in addition.  He was started on prednisone and Mestinon around 2012-2014.  He had variable follow-up in neurology clinic related to insurance status, finances and compliance.  Over the years he would take prednisone on and Mestinon intermittently for few days or few weeks at a time based on his symptoms.  Last seen by Dr. Jannifer Franklin in this clinic in July 2021.  No prior generalized or bulbar symptoms.  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  Patient denies any recurrence of symptoms lately.  He has some mild intermittent left ptosis and blurred vision.   PRIOR HPI (03/30/14, Dr. Jannifer Franklin): 50 year old right-handed white male with a history of myasthenia gravis. He has had a left-sided Bell's palsy, but he developed recurring weakness and was seen in January 2014. The patient was found to have elevated acetylcholine receptor antibody levels, and a CT scan of the chest was unremarkable. The patient placed on prednisone and Mestinon, but he was lost to follow-up. The patient apparently has not been on any medication for least a year or year and a half. He has done quite well until  about 2 weeks ago when he had recurrence of left facial weakness. He has had ptosis of the left eye that may close that eye. The patient denies any overt double vision. He denies any weakness of the extremities. He has not had any weakness with chewing, or change in speech or swallowing. He returns for an evaluation. He is back on prednisone, currently on 20 mg daily. The prednisone has improved his symptoms.  REVIEW OF SYSTEMS: Full 14 system review of systems performed and negative with exception of: As per HPI.  ALLERGIES: No Known Allergies  HOME MEDICATIONS: Outpatient Medications Prior to Visit  Medication Sig Dispense Refill   lisinopril (ZESTRIL) 20 MG tablet Take 1 tablet (20 mg total) by mouth daily. 30 tablet 3   psyllium (METAMUCIL) 28 % packet Take 1 packet by mouth daily as needed ([facility]).     Vitamin D, Ergocalciferol, (DRISDOL) 1.25 MG (50000 UNIT) CAPS capsule Take 50,000 Units by mouth once a week.     predniSONE (DELTASONE) 10 MG tablet Take 1 tablet (10 mg total) by mouth daily with breakfast. (Patient taking differently: Take 10 mg by mouth daily as needed (Lazy eye).) 90 tablet 1   pyridostigmine (MESTINON) 60 MG tablet Take 0.5 tablets (30 mg total) by mouth 4 (four) times daily. (Patient taking differently: Take 30 mg by mouth daily as needed (When eye gets lazy).) 60 tablet 5   Na Sulfate-K Sulfate-Mg Sulf (SUPREP BOWEL PREP KIT) 17.5-3.13-1.6 GM/177ML SOLN Take 1 kit by  mouth as directed. For colonoscopy prep 354 mL 0   No facility-administered medications prior to visit.    PAST MEDICAL HISTORY: Past Medical History:  Diagnosis Date   Asthma    as a child   Bell's palsy    Recurrent   COVID-19 10/2018   Hypertension    Myasthenia gravis with exacerbation ([place]) 03/30/2014   Obesity     PAST SURGICAL HISTORY: Past Surgical History:  Procedure Laterality Date    OPEN VENTRAL HERNIA REPAIR WITH MESH (N/A Abdomen)  04/26/2020   COLONOSCOPY WITH  PROPOFOL N/A 09/12/2020   Procedure: COLONOSCOPY WITH PROPOFOL;  Surgeon: Irving Copas., MD;  Location: [facility];  Service: Gastroenterology;  Laterality: N/A;   POLYPECTOMY  09/12/2020   Procedure: POLYPECTOMY;  Surgeon: Mansouraty, Telford Nab., MD;  Location: [facility];  Service: Gastroenterology;;   tonsil abscess     VENTRAL HERNIA REPAIR N/A 04/26/2020   Procedure: OPEN VENTRAL HERNIA REPAIR WITH MESH;  Surgeon: Jesusita Oka, MD;  Location: [facility];  Service: General;  Laterality: N/A;    FAMILY HISTORY: Family History  Problem Relation Age of Onset   Healthy Mother    Glaucoma Father    Diabetes Paternal Grandfather    Colon cancer Neg Hx    Esophageal cancer Neg Hx    Inflammatory bowel disease Neg Hx    Liver disease Neg Hx    Pancreatic cancer Neg Hx    Rectal cancer Neg Hx    Stomach cancer Neg Hx     SOCIAL HISTORY: Social History   Socioeconomic History   Marital status: Single    Spouse name: Not on file   Number of children: 2   Years of education: College   Highest education level: Not on file  Occupational History   Occupation: Driver [employer]  Tobacco Use   Smoking status: Former    Types: Cigarettes    Quit date: 02/12/1989    Years since quitting: 31.9   Smokeless tobacco: Never  Vaping Use   Vaping Use: Never used  Substance and Sexual Activity   Alcohol use: Yes    Comment: Consumes alcohol on special occasion   Drug use: No   Sexual activity: Not on file  Other Topics Concern   Not on file  Social History Narrative   Patient is right handed.   Patient does not drink caffeine.   Social Determinants of Health   Financial Resource Strain: Not on file  Food Insecurity: Not on file  Transportation Needs: Not on file  Physical Activity: Not on file  Stress: Not on file  Social Connections: Not on file  Intimate Partner Violence: Not on file     PHYSICAL EXAM  GENERAL EXAM/CONSTITUTIONAL: Vitals:  Vitals:   01/11/21 1512  01/11/21 1520  BP: (!) 170/81 (!) 152/92  Pulse: 82 83  Weight: (!) 320 lb 9.6 oz (145.4 kg)   Height: _0  (1.676 m)    Body mass index is 51.75 kg/m. Wt Readings from Last 3 Encounters:  01/11/21 (!) 320 lb 9.6 oz (145.4 kg)  09/12/20 (!) 321 lb 14 oz (146 kg)  07/06/20 (!) 322 lb (146.1 kg)   Patient is in no distress; well developed, nourished and groomed; neck is supple  CARDIOVASCULAR: Examination of carotid arteries is normal; no carotid bruits Regular rate and rhythm, no murmurs Examination of peripheral vascular system by observation and palpation is normal  EYES: Ophthalmoscopic exam of optic discs and posterior segments is normal;  no papilledema or hemorrhages No results found.  MUSCULOSKELETAL: Gait, strength, tone, movements noted in Neurologic exam below  NEUROLOGIC: MENTAL STATUS:  No flowsheet data found. awake, alert, oriented to person, place and time recent and remote memory intact normal attention and concentration language fluent, comprehension intact, naming intact fund of knowledge appropriate  CRANIAL NERVE:  2nd - no papilledema on fundoscopic exam 2nd, 3rd, 4th, 6th - pupils equal and reactive to light, visual fields full to confrontation, extraocular muscles intact, no nystagmus 5th - facial sensation symmetric 7th - facial strength --> left upper and lower facial weakness (with synkinesis on smiling) 8th - hearing intact 9th - palate elevates symmetrically, uvula midline 11th - shoulder shrug symmetric 12th - tongue protrusion midline SUBTLE INCREASED LEFT PTOSIS WITH SUSTAINED UPGAZE  MOTOR:  normal bulk and tone, full strength in the BUE, BLE  SENSORY:  normal and symmetric to light touch, temperature, vibration  COORDINATION:  finger-nose-finger, fine finger movements normal  REFLEXES:  deep tendon reflexes TRACE and symmetric  GAIT/STATION:  narrow based gait     DIAGNOSTIC DATA (LABS, IMAGING, TESTING) - I reviewed  patient records, labs, notes, testing and imaging myself where available.  Lab Results  Component Value Date   WBC 5.6 09/01/2019   HGB 13.5 09/01/2019   HCT 44.0 09/01/2019   MCV 84 09/01/2019   PLT 306 09/01/2019      Component Value Date/Time   NA 141 09/01/2019 0807   K 4.7 09/01/2019 0807   CL 101 09/01/2019 0807   CO2 29 09/01/2019 0807   GLUCOSE 104 (H) 09/01/2019 0807   GLUCOSE 100 (H) 05/24/2015 1828   BUN 6 09/01/2019 0807   CREATININE 0.98 09/01/2019 0807   CREATININE 0.78 05/24/2015 1828   CALCIUM 9.3 09/01/2019 0807   PROT 7.3 09/01/2019 0807   ALBUMIN 3.9 (L) 09/01/2019 0807   AST 15 09/01/2019 0807   ALT 16 09/01/2019 0807   ALKPHOS 63 09/01/2019 0807   BILITOT 0.3 09/01/2019 0807   GFRNONAA 90 09/01/2019 0807   GFRAA 103 09/01/2019 0807   Lab Results  Component Value Date   CHOL 265 (H) 07/13/2020   HDL 40 07/13/2020   LDLCALC 202 (H) 07/13/2020   TRIG 102 07/13/2020   CHOLHDL 6.6 (H) 07/13/2020   Lab Results  Component Value Date   HGBA1C 6.3 (A) 09/27/2017   No results found for: [id number] Lab Results  Component Value Date   TSH 0.922 02/11/2014       ASSESSMENT AND PLAN  50 y.o. year old male here with history of ocular myasthenia gravis since approximately 2012; also prior left Bell's palsy.  Overall patient stable.   Dx:  1. 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    Dispense:  60 tablet     Refill:  12   Return in about 14 months (around 03/13/2022).    VPenni Bombard MD 132/67/1245 48:09PM Certified in  Neurology, Neurophysiology and Neuroimaging  [facility] [address], [place], [place] [address] [phone]

## 2021-01-11 NOTE — Patient Instructions (Addendum)
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

## 2021-01-12 ENCOUNTER — Encounter: Payer: Self-pay | Admitting: Diagnostic Neuroimaging

## 2021-01-22 LAB — MUSK ANTIBODIES: MuSK Antibodies: <1 U/mL

## 2021-01-22 LAB — ACHR ABS WITH REFLEX TO MUSK: AChR Binding Ab, Serum: 0.12 nmol/L (ref 0.00–0.24)

## 2021-01-23 ENCOUNTER — Telehealth: Payer: Self-pay

## 2021-01-23 NOTE — Telephone Encounter (Signed)
-----   Message from Penni Bombard, MD sent at 01/23/2021  3:40 PM EST ----- Normal labs. Please call patient. -VRP

## 2021-01-23 NOTE — Telephone Encounter (Signed)
Contacted pt, LVM per DPR, informing him labs were normal, advised to call the office back with questions.

## 2021-06-21 ENCOUNTER — Ambulatory Visit: Payer: Commercial Managed Care - PPO | Admitting: Diagnostic Neuroimaging

## 2021-06-21 ENCOUNTER — Encounter: Payer: Self-pay | Admitting: Diagnostic Neuroimaging

## 2021-06-21 VITALS — BP 142/94 | HR 81 | Ht 66.0 in | Wt 332.2 lb

## 2021-06-21 DIAGNOSIS — G7000 Myasthenia gravis without (acute) exacerbation: Secondary | ICD-10-CM | POA: Diagnosis not present

## 2021-06-21 NOTE — Patient Instructions (Signed)
-   restart pyridostigmine '30mg'$  DAILY; THEN MAY INCREASE TO 2-3 times per day (try to stay on consistent schedule) ?

## 2021-06-21 NOTE — Progress Notes (Signed)
? ?[facility] ? ?PATIENT: Brent Cruz ?DOB: [birth year] ? ?REFERRING CLINICIAN: Nolene Ebbs, MD ?HISTORY FROM: patient  ?REASON FOR VISIT: follow up ? ? ?HISTORICAL ? ?CHIEF COMPLAINT:  ?Chief Complaint  ?Patient presents with  ? Follow-up  ?  Rm 6 here for f/u visit for drooping eye lids. PT reports on the right eye increased droopiness has been noted over the last few weeks. He has ben taking the mestinon 60 mg PRN and had left over prednisone 10 mg has been taking a few of these as well.    ? ? ?HISTORY OF PRESENT ILLNESS:  ? ?UPDATE (06/21/21, VRP): Since last visit, doing well until last week, then having more right sided ptosis. No double vision. No speech, swallowing issues. Now back on mestinon and sxs improving.  ? ?PRIOR HPI (01/11/21): 51 year old male here for evaluation of myasthenia gravis. ? ?Patient had onset of left facial weakness around 2000-2010 (?), diagnosed with Bell's palsy.  Over time he had some recurrence of symptoms and ultimately was diagnosed with ocular myasthenia gravis in addition.  He was started on prednisone and Mestinon around 2012-2014.  He had variable follow-up in neurology clinic related to insurance status, finances and compliance.  Over the years he would take prednisone on and Mestinon intermittently for few days or few weeks at a time based on his symptoms.  Last seen by Dr. Jannifer Franklin in this clinic in July 2021.  No prior generalized or bulbar symptoms. ? 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  Patient denies any recurrence of symptoms lately.  He has some mild intermittent left ptosis and blurred vision. ? ? ?PRIOR HPI (03/30/14, Dr. Jannifer Franklin): 51 year old right-handed white male with a history of myasthenia gravis. He has had a left-sided Bell's palsy, but he developed recurring  weakness and was seen in January 2014. The patient was found to have elevated acetylcholine receptor antibody levels, and a CT scan of the chest was unremarkable. The patient placed on prednisone and Mestinon, but he was lost to follow-up. The patient apparently has not been on any medication for least a year or year and a half. He has done quite well until about 2 weeks ago when he had recurrence of left facial weakness. He has had ptosis of the left eye that may close that eye. The patient denies any overt double vision. He denies any weakness of the extremities. He has not had any weakness with chewing, or change in speech or swallowing. He returns for an evaluation. He is back on prednisone, currently on 20 mg daily. The prednisone has improved his symptoms. ? ?REVIEW OF SYSTEMS: Full 14 system review of systems performed and negative with exception of: As per HPI. ? ?ALLERGIES: ?No Known Allergies ? ?HOME MEDICATIONS: ?Outpatient Medications Prior to Visit  ?Medication Sig Dispense Refill  ? lisinopril (ZESTRIL) 20 MG tablet Take 1 tablet (20 mg total) by mouth daily. 30 tablet 3  ? pyridostigmine (MESTINON) 60 MG tablet Take 0.5-1 tablets (30-60 mg total) by mouth 3 (three) times daily as needed. 60 tablet 12  ? Vitamin D, Ergocalciferol, (DRISDOL) 1.25 MG (50000 UNIT) CAPS capsule Take 50,000 Units by mouth once a week.    ? predniSONE (DELTASONE) 10 MG tablet Take 10 mg by mouth.    ? psyllium (METAMUCIL) 28 %  packet Take 1 packet by mouth daily as needed ([facility]). (Patient not taking: Reported on 06/21/2021)    ? ?No facility-administered medications prior to visit.  ? ? ?PAST MEDICAL HISTORY: ?Past Medical History:  ?Diagnosis Date  ? Asthma   ? as a child  ? Bell's palsy   ? Recurrent  ? COVID-19 10/2018  ? Hypertension   ? Myasthenia gravis with exacerbation ([place]) 03/30/2014  ? Obesity   ? ? ?PAST SURGICAL HISTORY: ?Past Surgical History:  ?Procedure Laterality Date  ?  OPEN VENTRAL HERNIA REPAIR  WITH MESH (N/A Abdomen)  04/26/2020  ? COLONOSCOPY WITH PROPOFOL N/A 09/12/2020  ? Procedure: COLONOSCOPY WITH PROPOFOL;  Surgeon: Mansouraty, Telford Nab., MD;  Location: [facility];  Service: Gastroenterology;  Laterality: N/A;  ? POLYPECTOMY  09/12/2020  ? Procedure: POLYPECTOMY;  Surgeon: Mansouraty, Telford Nab., MD;  Location: [facility];  Service: Gastroenterology;;  ? tonsil abscess    ? VENTRAL HERNIA REPAIR N/A 04/26/2020  ? Procedure: OPEN VENTRAL HERNIA REPAIR WITH MESH;  Surgeon: Jesusita Oka, MD;  Location: [facility];  Service: General;  Laterality: N/A;  ? ? ?FAMILY HISTORY: ?Family History  ?Problem Relation Age of Onset  ? Healthy Mother   ? Glaucoma Father   ? Diabetes Paternal Grandfather   ? Colon cancer Neg Hx   ? Esophageal cancer Neg Hx   ? Inflammatory bowel disease Neg Hx   ? Liver disease Neg Hx   ? Pancreatic cancer Neg Hx   ? Rectal cancer Neg Hx   ? Stomach cancer Neg Hx   ? ? ?SOCIAL HISTORY: ?Social History  ? ?Socioeconomic History  ? Marital status: Single  ?  Spouse name: Not on file  ? Number of children: 2  ? Years of education: College  ? Highest education level: Not on file  ?Occupational History  ? Occupation: Driver [employer]  ?Tobacco Use  ? Smoking status: Former  ?  Types: Cigarettes  ?  Quit date: 02/12/1989  ?  Years since quitting: 32.3  ? Smokeless tobacco: Never  ?Vaping Use  ? Vaping Use: Never used  ?Substance and Sexual Activity  ? Alcohol use: Yes  ?  Comment: Consumes alcohol on special occasion  ? Drug use: No  ? Sexual activity: Not on file  ?Other Topics Concern  ? Not on file  ?Social History Narrative  ? Patient is right handed.  ? Patient does not drink caffeine.  ? ?Social Determinants of Health  ? ?Financial Resource Strain: Not on file  ?Food Insecurity: Not on file  ?Transportation Needs: Not on file  ?Physical Activity: Not on file  ?Stress: Not on file  ?Social Connections: Not on file  ?Intimate Partner Violence: Not on file  ? ? ? ?PHYSICAL EXAM ? ?GENERAL  EXAM/CONSTITUTIONAL: ?Vitals:  ?Vitals:  ? 06/21/21 0922  ?BP: (!) 142/94  ?Pulse: 81  ?SpO2: 94%  ?Weight: (!) 332 lb 4 oz (150.7 kg)  ?Height: '5\' 6"'$  (1.676 m)  ? ? ?Body mass index is 53.63 kg/m?. ?Wt Readings from Last 3 Encounters:  ?06/21/21 (!) 332 lb 4 oz (150.7 kg)  ?01/11/21 (!) 320 lb 9.6 oz (145.4 kg)  ?09/12/20 (!) 321 lb 14 oz (146 kg)  ? ?Patient is in no distress; well developed, nourished and groomed; neck is supple ? ?CARDIOVASCULAR: ?Examination of carotid arteries is normal; no carotid bruits ?Regular rate and rhythm, no murmurs ?Examination of peripheral vascular system by observation and palpation is normal ? ?EYES: ?Ophthalmoscopic  exam of optic discs and posterior segments is normal; no papilledema or hemorrhages ?No results found. ? ?MUSCULOSKELETAL: ?Gait, strength, tone, movements noted in Neurologic exam below ? ?NEUROLOGIC: ?MENTAL STATUS:  ?   ? View : No data to display.  ?  ?  ?  ? ?awake, alert, oriented to person, place and time ?recent and remote memory intact ?normal attention and concentration ?language fluent, comprehension intact, naming intact ?fund of knowledge appropriate ? ?CRANIAL NERVE:  ?2nd - no papilledema on fundoscopic exam ?2nd, 3rd, 4th, 6th - pupils equal and reactive to light, visual fields full to confrontation, extraocular muscles intact, no nystagmus; RIGHT PTOSIS MILD ?5th - facial sensation symmetric ?7th - facial strength --> left upper and lower facial weakness (with synkinesis on smiling) ?8th - hearing intact ?9th - palate elevates symmetrically, uvula midline ?11th - shoulder shrug symmetric ?12th - tongue protrusion midline ? ?MOTOR:  ?normal bulk and tone, full strength in the BUE, BLE ? ?SENSORY:  ?normal and symmetric to light touch, temperature, vibration ? ?COORDINATION:  ?finger-nose-finger, fine finger movements normal ? ?REFLEXES:  ?deep tendon reflexes TRACE and symmetric ? ?GAIT/STATION:  ?narrow based gait ? ? ? ? ?DIAGNOSTIC DATA (LABS,  IMAGING, TESTING) ?- I reviewed patient records, labs, notes, testing and imaging myself where available. ? ?Lab Results  ?Component Value Date  ? WBC 5.6 09/01/2019  ? HGB 13.5 09/01/2019  ? HCT 44.0 07/20

## 2021-07-05 ENCOUNTER — Telehealth: Payer: Self-pay | Admitting: Diagnostic Neuroimaging

## 2021-07-05 NOTE — Telephone Encounter (Signed)
Pt states he wants Dr Leta Baptist to know he feels like he really needs to be back on his Prednisone along with his pyridostigmine (MESTINON) 60 MG tablet, please call.

## 2021-07-07 NOTE — Telephone Encounter (Signed)
Pt has called as a result of not hearing from anyone,  the my chart message was relayed to him by phone rep.  Pt states he does not have access to his my chart as to why he was unaware of the 05-24 message.  Pt states he really needs the Prednisone along with the other medication for his vision as a result of reoccurring symptoms.  Pt has asked this be forwarded to On call Dr to  see if this can be called into the [facility] today.

## 2021-07-07 NOTE — Telephone Encounter (Signed)
I called patient. No answer. -VRP

## 2021-10-24 ENCOUNTER — Telehealth: Payer: Commercial Managed Care - PPO | Admitting: Diagnostic Neuroimaging

## 2021-10-24 ENCOUNTER — Telehealth: Payer: Self-pay | Admitting: Diagnostic Neuroimaging

## 2021-10-24 NOTE — Telephone Encounter (Signed)
Pt called and said he don't have log in information for mychart and needed to cancel appointment.

## 2021-10-25 NOTE — Telephone Encounter (Signed)
Called Pt. Reset his mychart password and made a new VV for 10/31 @ 1:30 with Dr. Leta Baptist.

## 2021-10-25 NOTE — Telephone Encounter (Signed)
Noted  

## 2021-12-12 ENCOUNTER — Telehealth: Payer: Commercial Managed Care - PPO | Admitting: Diagnostic Neuroimaging

## 2022-01-10 ENCOUNTER — Telehealth: Payer: Self-pay | Admitting: *Deleted

## 2022-01-10 NOTE — Telephone Encounter (Signed)
Pt has called [facility] and been informed multiple times to please give [place] time to review. Rq has been sent to MD.

## 2022-01-10 NOTE — Telephone Encounter (Signed)
This pt was last seen in May, had appt in Sept but n/s. Ok to provide letter stating he can drive or do you want to see him first?

## 2022-01-10 NOTE — Telephone Encounter (Signed)
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 Need letter today. [phone]

## 2022-01-10 NOTE — Telephone Encounter (Signed)
Pt's DOT will be expiring tomorrow and he would like to know when he would bel able to get this letter.

## 2022-01-11 NOTE — Telephone Encounter (Signed)
Called pt and informed him letter is up front for pick up. Advised to scheduled a FU with Dr Leta Baptist when he comes in. N/s in Sept and left w.o being seen in October.

## 2022-01-11 NOTE — Telephone Encounter (Signed)
Letter printed, on MD desk for signature

## 2022-01-11 NOTE — Telephone Encounter (Signed)
Pt called for 4th time wanting DOT letter. Needs letter today. [phone]

## 2022-02-27 ENCOUNTER — Other Ambulatory Visit: Payer: Self-pay | Admitting: Diagnostic Neuroimaging

## 2022-02-27 ENCOUNTER — Telehealth: Payer: Self-pay | Admitting: Diagnostic Neuroimaging

## 2022-02-27 NOTE — Telephone Encounter (Signed)
Pt is calling. Stated he needs a refill on predniSONE. Refill should be sent to [facility]

## 2022-02-27 NOTE — Telephone Encounter (Signed)
Phone rep called pt and relayed response from [place], [place].  Phone rep advised pt she would not be able to schedule him at this time due to bad debt, pt will call back to address that and go from there in making an appointment.  Pt did say that re: both  predniSONE.  And pyridostigmine  both were prescribed by Dr Leta Baptist.

## 2022-02-27 NOTE — Telephone Encounter (Signed)
Called the pharmacy. They are currently on lunch. Left a VM advising the pharmacy that I was calling to discuss prednisone that patient is requesting. I don't see on my side where Dr Leta Baptist has prescribed this although its discussed some in the last ov it is not in the plan. I have requested [facility] call back to advise when the patient last filled the medication and who was the ordering MD.   If I am unable to take call when pharmacist call back, please ask them to advise when the patient last filled the medication, how it was prescribed and who was the ordering MD.

## 2022-02-28 NOTE — Telephone Encounter (Signed)
Called pt. Informed him of message from [place]. He said that is fine for PCP to fill medication and I scheduled an appointment with Janett Billow on 7/10 @ 2:30 pm.

## 2022-02-28 NOTE — Telephone Encounter (Signed)
Called the pharmacy and they advised that the patient is getting prednisone and they have this prescription ready for pick up. It was sent by his PCP and prior to this time it was also filled by PCP.  PCP will continue to fill this medication for the patient.

## 2022-02-28 NOTE — Telephone Encounter (Signed)
Noted  

## 2022-03-21 ENCOUNTER — Telehealth: Payer: Self-pay | Admitting: Diagnostic Neuroimaging

## 2022-03-21 NOTE — Telephone Encounter (Signed)
Pt states the pyridostigmine (MESTINON) 60 MG tablet  is not working like it once did.  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  Abbott Pao is asking for a call re: if there will be a change to medication or if he should just schedule an appointment.

## 2022-03-22 NOTE — Telephone Encounter (Signed)
Noted, thank you

## 2022-03-22 NOTE — Telephone Encounter (Signed)
Called pt. Scheduled appointment on 2/12 @ 10:30 with Dr Leta Baptist.

## 2022-03-26 ENCOUNTER — Ambulatory Visit: Payer: Commercial Managed Care - PPO | Admitting: Diagnostic Neuroimaging

## 2022-03-26 ENCOUNTER — Encounter: Payer: Self-pay | Admitting: Diagnostic Neuroimaging

## 2022-03-27 ENCOUNTER — Encounter: Payer: Self-pay | Admitting: Diagnostic Neuroimaging

## 2022-03-27 ENCOUNTER — Ambulatory Visit (INDEPENDENT_AMBULATORY_CARE_PROVIDER_SITE_OTHER): Payer: Commercial Managed Care - PPO | Admitting: Diagnostic Neuroimaging

## 2022-03-27 VITALS — BP 149/90 | HR 77 | Ht 66.0 in | Wt 334.6 lb

## 2022-03-27 DIAGNOSIS — G7000 Myasthenia gravis without (acute) exacerbation: Secondary | ICD-10-CM | POA: Diagnosis not present

## 2022-03-27 NOTE — Progress Notes (Signed)
[facility]  PATIENT: Brent Cruz DOB: June 07, 1970  REFERRING CLINICIAN: Nolene Ebbs, MD HISTORY FROM: patient  REASON FOR VISIT: follow up   HISTORICAL  CHIEF COMPLAINT:  Chief Complaint  Patient presents with   Follow-up    Patient in room #6 and alone. Pt here today to discuss new medication.    HISTORY OF PRESENT ILLNESS:   UPDATE (03/27/22, VRP): Since last visit, doing well until last week; new onset of left ptosis, fluctuating. On pyridostigmine 72m twice a day. No issues with arms, legs, swallowing, breathing.   UPDATE (06/21/21, VRP): Since last visit, doing well until last week, then having more right sided ptosis. No double vision. No speech, swallowing issues. Now back on mestinon and sxs improving.   PRIOR HPI (01/11/21): 52year old male here for evaluation of myasthenia gravis.  Patient had onset of left facial weakness around 2000-2010 (?), diagnosed with Bell's palsy.  Over time he had some recurrence of symptoms and ultimately was diagnosed with ocular myasthenia gravis in addition.  He was started on prednisone and Mestinon around 2012-2014.  He had variable follow-up in neurology clinic related to insurance status, finances and compliance.  Over the years he would take prednisone on and Mestinon intermittently for few days or few weeks at a time based on his symptoms.  Last seen by Dr. WJannifer Franklinin this clinic in July 2021.  No prior generalized or bulbar symptoms.  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  Patient denies any recurrence of symptoms lately.  He has some mild intermittent left ptosis and blurred vision.  PRIOR HPI (03/30/14, Dr. WJannifer Franklin: 52year old right-handed white male with a history of myasthenia gravis. He has had a left-sided Bell's palsy, but he  developed recurring weakness and was seen in January 2014. The patient was found to have elevated acetylcholine receptor antibody levels, and a CT scan of the chest was unremarkable. The patient placed on prednisone and Mestinon, but he was lost to follow-up. The patient apparently has not been on any medication for least a year or year and a half. He has done quite well until about 2 weeks ago when he had recurrence of left facial weakness. He has had ptosis of the left eye that may close that eye. The patient denies any overt double vision. He denies any weakness of the extremities. He has not had any weakness with chewing, or change in speech or swallowing. He returns for an evaluation. He is back on prednisone, currently on 20 mg daily. The prednisone has improved his symptoms.   REVIEW OF SYSTEMS: Full 14 system review of systems performed and negative with exception of: As per HPI.  ALLERGIES: No Known Allergies  HOME MEDICATIONS: Outpatient Medications Prior to Visit  Medication Sig Dispense Refill   amLODipine (NORVASC) 5 MG tablet Take 5 mg by mouth daily.     atorvastatin (LIPITOR) 20 MG tablet Take 20 mg by mouth at bedtime.     lisinopril (ZESTRIL) 20 MG tablet Take 1 tablet (20 mg total) by mouth daily. 30 tablet 3   pyridostigmine (MESTINON) 60 MG tablet TAKE 1/2 TO 1 (ONE-HALF TO ONE) TABLET BY MOUTH THREE TIMES DAILY AS NEEDED 60 tablet 0   Vitamin D, Ergocalciferol, (DRISDOL) 1.25 MG (50000 UNIT) CAPS capsule Take 50,000 Units by mouth once  a week.     No facility-administered medications prior to visit.    PAST MEDICAL HISTORY: Past Medical History:  Diagnosis Date   Asthma    as a child   Bell's palsy    Recurrent   COVID-19 10/2018   Hypertension    Myasthenia gravis with exacerbation ([place]) 03/30/2014   Obesity     PAST SURGICAL HISTORY: Past Surgical History:  Procedure Laterality Date    OPEN VENTRAL HERNIA REPAIR WITH MESH (N/A Abdomen)  04/26/2020    COLONOSCOPY WITH PROPOFOL N/A 09/12/2020   Procedure: COLONOSCOPY WITH PROPOFOL;  Surgeon: Irving Copas., MD;  Location: [facility];  Service: Gastroenterology;  Laterality: N/A;   POLYPECTOMY  09/12/2020   Procedure: POLYPECTOMY;  Surgeon: Mansouraty, Telford Nab., MD;  Location: [facility];  Service: Gastroenterology;;   tonsil abscess     VENTRAL HERNIA REPAIR N/A 04/26/2020   Procedure: OPEN VENTRAL HERNIA REPAIR WITH MESH;  Surgeon: Jesusita Oka, MD;  Location: [facility];  Service: General;  Laterality: N/A;    FAMILY HISTORY: Family History  Problem Relation Age of Onset   Healthy Mother    Glaucoma Father    Diabetes Paternal Grandfather    Colon cancer Neg Hx    Esophageal cancer Neg Hx    Inflammatory bowel disease Neg Hx    Liver disease Neg Hx    Pancreatic cancer Neg Hx    Rectal cancer Neg Hx    Stomach cancer Neg Hx     SOCIAL HISTORY: Social History   Socioeconomic History   Marital status: Single    Spouse name: Not on file   Number of children: 2   Years of education: College   Highest education level: Not on file  Occupational History   Occupation: Driver [employer]  Tobacco Use   Smoking status: Former    Types: Cigarettes    Quit date: 02/12/1989    Years since quitting: 33.1   Smokeless tobacco: Never  Vaping Use   Vaping Use: Never used  Substance and Sexual Activity   Alcohol use: Yes    Comment: Consumes alcohol on special occasion   Drug use: No   Sexual activity: Not on file  Other Topics Concern   Not on file  Social History Narrative   Patient is right handed.   Patient does not drink caffeine.   Social Determinants of Health   Financial Resource Strain: Not on file  Food Insecurity: Not on file  Transportation Needs: Not on file  Physical Activity: Not on file  Stress: Not on file  Social Connections: Not on file  Intimate Partner Violence: Not on file     PHYSICAL EXAM  GENERAL EXAM/CONSTITUTIONAL: Vitals:  Vitals:    03/27/22 1201  BP: (!) 149/90  Pulse: 77  Weight: (!) 334 lb 9.6 oz (151.8 kg)  Height: 5' 6"$  (1.676 m)    Body mass index is 54.01 kg/m. Wt Readings from Last 3 Encounters:  03/27/22 (!) 334 lb 9.6 oz (151.8 kg)  06/21/21 (!) 332 lb 4 oz (150.7 kg)  01/11/21 (!) 320 lb 9.6 oz (145.4 kg)   Patient is in no distress; well developed, nourished and groomed; neck is supple  CARDIOVASCULAR: Examination of carotid arteries is normal; no carotid bruits Regular rate and rhythm, no murmurs Examination of peripheral vascular system by observation and palpation is normal  EYES: Ophthalmoscopic exam of optic discs and posterior segments is normal; no papilledema or hemorrhages No results found.  MUSCULOSKELETAL:  Gait, strength, tone, movements noted in Neurologic exam below  NEUROLOGIC: MENTAL STATUS:      No data to display         awake, alert, oriented to person, place and time recent and remote memory intact normal attention and concentration language fluent, comprehension intact, naming intact fund of knowledge appropriate  CRANIAL NERVE:  2nd - no papilledema on fundoscopic exam 2nd, 3rd, 4th, 6th - pupils equal and reactive to light, visual fields full to confrontation, extraocular muscles intact, no nystagmus; LEFT PTOSIS FLUCTUATING 5th - facial sensation symmetric 7th - facial strength --> left upper and lower facial weakness (with synkinesis on smiling) 8th - hearing intact 9th - palate elevates symmetrically, uvula midline 11th - shoulder shrug symmetric 12th - tongue protrusion midline  MOTOR:  normal bulk and tone, full strength in the BUE, BLE  SENSORY:  normal and symmetric to light touch, temperature, vibration  COORDINATION:  finger-nose-finger, fine finger movements normal  REFLEXES:  deep tendon reflexes TRACE and symmetric  GAIT/STATION:  narrow based gait     DIAGNOSTIC DATA (LABS, IMAGING, TESTING) - I reviewed patient records, labs,  notes, testing and imaging myself where available.  Lab Results  Component Value Date   WBC 5.6 09/01/2019   HGB 13.5 09/01/2019   HCT 44.0 09/01/2019   MCV 84 09/01/2019   PLT 306 09/01/2019      Component Value Date/Time   NA 141 09/01/2019 0807   K 4.7 09/01/2019 0807   CL 101 09/01/2019 0807   CO2 29 09/01/2019 0807   GLUCOSE 104 (H) 09/01/2019 0807   GLUCOSE 100 (H) 05/24/2015 1828   BUN 6 09/01/2019 0807   CREATININE 0.98 09/01/2019 0807   CREATININE 0.78 05/24/2015 1828   CALCIUM 9.3 09/01/2019 0807   PROT 7.3 09/01/2019 0807   ALBUMIN 3.9 (L) 09/01/2019 0807   AST 15 09/01/2019 0807   ALT 16 09/01/2019 0807   ALKPHOS 63 09/01/2019 0807   BILITOT 0.3 09/01/2019 0807   GFRNONAA 90 09/01/2019 0807   GFRAA 103 09/01/2019 0807   Lab Results  Component Value Date   CHOL 265 (H) 07/13/2020   HDL 40 07/13/2020   LDLCALC 202 (H) 07/13/2020   TRIG 102 07/13/2020   CHOLHDL 6.6 (H) 07/13/2020   Lab Results  Component Value Date   HGBA1C 6.3 (A) 09/27/2017   No results found for: "VITAMINB12" Lab Results  Component Value Date   TSH 0.922 02/11/2014   01/11/21      Component Ref Range & Units 1 yr ago  AChR Binding Ab, Serum 0.00 - 0.24 nmol/L 0.12     Component Ref Range & Units 1 yr ago  MuSK Antibodies U/mL <1.0   03/12/12 CT chest - No evidence of acute cardiopulmonary disease.  - Specifically, no findings to suggest thymoma.    ASSESSMENT AND PLAN  52 y.o. year old male here with history of ocular myasthenia gravis since approximately 2012; also prior left Bell's palsy.     Dx:  1. 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    VIPenni Bombard  MD [id number], [id number] PM Certified in Neurology, Neurophysiology and [facility] [address], [place], [place] [address] [phone]

## 2022-03-27 NOTE — Patient Instructions (Signed)
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

## 2022-03-30 ENCOUNTER — Telehealth: Payer: Self-pay | Admitting: Diagnostic Neuroimaging

## 2022-03-30 NOTE — Telephone Encounter (Signed)
Pt called stating that there is not much of a difference with the pyridostigmine (MESTINON) 60 MG tablet and would like to be advised what changes in medication can there be.

## 2022-04-02 ENCOUNTER — Other Ambulatory Visit: Payer: Self-pay | Admitting: Neurology

## 2022-04-02 NOTE — Telephone Encounter (Signed)
Pt is taking mestinon 60 mg TID and was taking prednisone 10 mg daily (according to phone note end of January, it was prescribed by PCP)   Called the patient and when I questioned if he was taking prednisone in addition to the mestinon TID, he states that at the visit Dr Leta Baptist advised he should hold/stop the prednisone and just do the mestinon TID. Pt states that he has noticed some improvement but not as good as when he was taking the prednisone with it. Pt states that Dr Leta Baptist had stated if he felt the mestinon alone wasn't helping he would restart the prednisone. I advised the patient that Dr Leta Baptist is out of the office today but informed him I would bring to his attention tomorrow upon his return. Confirmed the pharmacy on file was correct. Pt is agreeable to waiting until Dr Leta Baptist returns.

## 2022-04-05 MED ORDER — PREDNISONE 10 MG PO TABS: 10.0000 mg | ORAL_TABLET | Freq: Every day | ORAL | 6 refills | Status: DC

## 2022-04-05 NOTE — Telephone Encounter (Signed)
Restart prednisone 2m daily. Monitor BP and sugar. Use prevacid / prilosec otc.    Meds ordered this encounter  Medications   predniSONE (DELTASONE) 10 MG tablet    Sig: Take 1 tablet (10 mg total) by mouth daily with breakfast.    Dispense:  30 tablet    Refill:  [place] MD [id number] Certified in Neurology, Neurophysiology and Neuroimaging  [facility] [address] [place] [phone]

## 2022-04-05 NOTE — Addendum Note (Signed)
Addended by: Penni Bombard on: 04/05/2022 04:51 PM   Modules accepted: Orders

## 2022-04-05 NOTE — Telephone Encounter (Signed)
Called the patient and reviewed the recommendations made by the MD. Pt states he had been taking the 10 mg dose that he had remaining. States he can notice some improvement but doesn't feel that it has been helping like it used to. Still notes that there is weakness in eyes. Advised the patient to give it through the weekend and see if he begins to note benefit and if not let us know come Monday and we will see if changes need to be made. Pt verbalized understanding. Pt had no questions at this time but was encouraged to call back if questions arise.

## 2022-04-11 MED ORDER — PREDNISONE 20 MG PO TABS: 20.0000 mg | ORAL_TABLET | Freq: Every day | ORAL | 5 refills | Status: DC

## 2022-04-11 NOTE — Telephone Encounter (Signed)
Pt has called to report that the medications :pyridostigmine and predniSONE are helping but not quickly, there is progress re: eye opening some but not enough where pt would feel comfortable reporting back to work.  Pt would like  a call to discuss that and the possibility of increasing the strength and times a day that he takes the predniSONE, please call.

## 2022-04-11 NOTE — Addendum Note (Signed)
Addended by: Darleen Crocker on: 04/11/2022 02:10 PM   Modules accepted: Orders

## 2022-04-11 NOTE — Telephone Encounter (Signed)
Called the patient back. Advised that Dr Leta Baptist would recommend increasing the dose to 20 mg daily of the prednisone. Pt has been out of work since 03/23/22. He would like another week to give the medication time to work. I have extended the RTW date til Mar 9,2024. FMLA paperwork came in for him that needs to reflect this. Advised I would work on this.

## 2022-04-11 NOTE — Telephone Encounter (Signed)
Pt called and wanted to inform RN that the forms have to be done by March 2nd.

## 2022-04-11 NOTE — Telephone Encounter (Signed)
Forms received /completed and placed on Dr [employer] desk to sign for the patient.

## 2022-04-11 NOTE — Telephone Encounter (Signed)
Increase prednisone to '20mg'$  daily. -VRP

## 2022-04-12 ENCOUNTER — Telehealth: Payer: Self-pay | Admitting: *Deleted

## 2022-04-12 NOTE — Telephone Encounter (Signed)
Form signed and given to Hilda Blades in medical records to submit for the patient

## 2022-04-12 NOTE — Telephone Encounter (Addendum)
Pt fmla source form  faxed on 04/06/2022

## 2022-04-16 ENCOUNTER — Telehealth: Payer: Self-pay | Admitting: *Deleted

## 2022-04-16 NOTE — Telephone Encounter (Signed)
Pt called and stated he wants to let Dr. Leta Baptist know that his eye is half open but not open as normal. Pt is requesting a call back from nurse to discuss.

## 2022-04-16 NOTE — Telephone Encounter (Signed)
Pt called need to change his FMLA form with the correct date or diagnosis. Please call [phone]

## 2022-04-16 NOTE — Telephone Encounter (Signed)
Had Debra contact the patient since I have already made sure the dates and dg are listed on the form. She states they wanted office notes sent in.

## 2022-04-18 ENCOUNTER — Encounter: Payer: Self-pay | Admitting: Neurology

## 2022-04-18 NOTE — Telephone Encounter (Signed)
Called pt and schedule an follow-up appointment with Dr. Leta Baptist on 3/12 @ 9:30 am. Pt asked for a note for work note until his appointment with Dr. Leta Baptist, nurse Myriam Jacobson said she would take care of it. Pt said he will call back with a  work email or fax number so this could be sent to his employment.

## 2022-04-19 ENCOUNTER — Encounter: Payer: Self-pay | Admitting: Diagnostic Neuroimaging

## 2022-04-19 ENCOUNTER — Telehealth: Payer: Self-pay | Admitting: Neurology

## 2022-04-19 ENCOUNTER — Ambulatory Visit: Payer: Commercial Managed Care - PPO | Admitting: Diagnostic Neuroimaging

## 2022-04-19 VITALS — BP 143/89 | HR 93 | Ht 66.0 in | Wt 334.2 lb

## 2022-04-19 DIAGNOSIS — G7000 Myasthenia gravis without (acute) exacerbation: Secondary | ICD-10-CM | POA: Diagnosis not present

## 2022-04-19 NOTE — Telephone Encounter (Signed)
Pt presented today and we have extended the return to work to 05/22/2022. I have entered that date on the form and resubmitted the updated form and notes to FMLA people.  Provided a copy to Hilda Blades in medical records as well.

## 2022-04-19 NOTE — Progress Notes (Addendum)
[facility]  PATIENT: Brent Cruz DOB: 09-05-1970  REFERRING CLINICIAN: Nolene Ebbs, MD HISTORY FROM: patient  REASON FOR VISIT: follow up   HISTORICAL  CHIEF COMPLAINT:  Chief Complaint  Patient presents with   Follow-up    Patient in room #6 and alone. Pt here today to discuss his lazy left eye and his medication.    HISTORY OF PRESENT ILLNESS:   UPDATE (04/19/22, VRP): Since last visit, doing about the same. Now on prednisone '20mg'$  daily. Left ptosis continues to fluctuate. No generalized symptoms.   UPDATE (03/27/22, VRP): Since last visit, doing well until last week; new onset of left ptosis, fluctuating. On pyridostigmine '60mg'$  twice a day. No issues with arms, legs, swallowing, breathing.   UPDATE (06/21/21, VRP): Since last visit, doing well until last week, then having more right sided ptosis. No double vision. No speech, swallowing issues. Now back on mestinon and sxs improving.   PRIOR HPI (01/11/21): 52 year old male here for evaluation of myasthenia gravis.  Patient had onset of left facial weakness around 2000-2010 (?), diagnosed with Bell's palsy.  Over time he had some recurrence of symptoms and ultimately was diagnosed with ocular myasthenia gravis in addition.  He was started on prednisone and Mestinon around 2012-2014.  He had variable follow-up in neurology clinic related to insurance status, finances and compliance.  Over the years he would take prednisone on and Mestinon intermittently for few days or few weeks at a time based on his symptoms.  Last seen by Dr. Jannifer Franklin in this clinic in July 2021.  No prior generalized or bulbar symptoms.  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  Patient denies any recurrence of symptoms lately.  He has some mild intermittent left ptosis  and blurred vision.  PRIOR HPI (03/30/14, Dr. Jannifer Franklin): 52 year old right-handed white male with a history of myasthenia gravis. He has had a left-sided Bell's palsy, but he developed recurring weakness and was seen in January 2014. The patient was found to have elevated acetylcholine receptor antibody levels, and a CT scan of the chest was unremarkable. The patient placed on prednisone and Mestinon, but he was lost to follow-up. The patient apparently has not been on any medication for least a year or year and a half. He has done quite well until about 2 weeks ago when he had recurrence of left facial weakness. He has had ptosis of the left eye that may close that eye. The patient denies any overt double vision. He denies any weakness of the extremities. He has not had any weakness with chewing, or change in speech or swallowing. He returns for an evaluation. He is back on prednisone, currently on 20 mg daily. The prednisone has improved his symptoms.   REVIEW OF SYSTEMS: Full 14 system review of systems performed and negative with exception of: As per HPI.  ALLERGIES: No Known Allergies  HOME MEDICATIONS: Outpatient Medications Prior to Visit  Medication Sig Dispense Refill   amLODipine (NORVASC) 5 MG tablet Take 5 mg by mouth daily.     atorvastatin (LIPITOR) 20 MG tablet Take 20 mg by mouth at bedtime.     lisinopril (ZESTRIL) 20 MG tablet Take 1 tablet (20 mg total) by mouth daily. 30 tablet 3   predniSONE (DELTASONE) 20 MG tablet Take 1 tablet (20 mg total)  by mouth daily with breakfast. 30 tablet 5   pyridostigmine (MESTINON) 60 MG tablet TAKE 1/2 TO 1 (ONE-HALF TO ONE) TABLET BY MOUTH THREE TIMES DAILY AS NEEDED 60 tablet 0   Vitamin D, Ergocalciferol, (DRISDOL) 1.25 MG (50000 UNIT) CAPS capsule Take 50,000 Units by mouth once a week.     No facility-administered medications prior to visit.    PAST MEDICAL HISTORY: Past Medical History:  Diagnosis Date   Asthma    as a child    Bell's palsy    Recurrent   COVID-19 10/2018   Hypertension    Myasthenia gravis with exacerbation ([place]) 03/30/2014   Obesity     PAST SURGICAL HISTORY: Past Surgical History:  Procedure Laterality Date    OPEN VENTRAL HERNIA REPAIR WITH MESH (N/A Abdomen)  04/26/2020   COLONOSCOPY WITH PROPOFOL N/A 09/12/2020   Procedure: COLONOSCOPY WITH PROPOFOL;  Surgeon: Irving Copas., MD;  Location: [facility];  Service: Gastroenterology;  Laterality: N/A;   POLYPECTOMY  09/12/2020   Procedure: POLYPECTOMY;  Surgeon: Mansouraty, Telford Nab., MD;  Location: [facility];  Service: Gastroenterology;;   tonsil abscess     VENTRAL HERNIA REPAIR N/A 04/26/2020   Procedure: OPEN VENTRAL HERNIA REPAIR WITH MESH;  Surgeon: Jesusita Oka, MD;  Location: [facility];  Service: General;  Laterality: N/A;    FAMILY HISTORY: Family History  Problem Relation Age of Onset   Healthy Mother    Glaucoma Father    Diabetes Paternal Grandfather    Colon cancer Neg Hx    Esophageal cancer Neg Hx    Inflammatory bowel disease Neg Hx    Liver disease Neg Hx    Pancreatic cancer Neg Hx    Rectal cancer Neg Hx    Stomach cancer Neg Hx     SOCIAL HISTORY: Social History   Socioeconomic History   Marital status: Single    Spouse name: Not on file   Number of children: 2   Years of education: College   Highest education level: Not on file  Occupational History   Occupation: Driver [employer]  Tobacco Use   Smoking status: Former    Types: Cigarettes    Quit date: 02/12/1989    Years since quitting: 33.2   Smokeless tobacco: Never  Vaping Use   Vaping Use: Never used  Substance and Sexual Activity   Alcohol use: Yes    Comment: Consumes alcohol on special occasion   Drug use: No   Sexual activity: Not on file  Other Topics Concern   Not on file  Social History Narrative   Patient is right handed.   Patient does not drink caffeine.   Social Determinants of Health   Financial Resource Strain: Not  on file  Food Insecurity: Not on file  Transportation Needs: Not on file  Physical Activity: Not on file  Stress: Not on file  Social Connections: Not on file  Intimate Partner Violence: Not on file     PHYSICAL EXAM  GENERAL EXAM/CONSTITUTIONAL: Vitals:  Vitals:   04/19/22 1330  BP: (!) 143/89  Pulse: 93  Weight: (!) 334 lb 3.2 oz (151.6 kg)  Height: '5\' 6"'$  (1.676 m)    Body mass index is 53.94 kg/m. Wt Readings from Last 3 Encounters:  04/19/22 (!) 334 lb 3.2 oz (151.6 kg)  03/27/22 (!) 334 lb 9.6 oz (151.8 kg)  06/21/21 (!) 332 lb 4 oz (150.7 kg)   Patient is in no distress; well developed, nourished and groomed; neck  is supple  CARDIOVASCULAR: Examination of carotid arteries is normal; no carotid bruits Regular rate and rhythm, no murmurs Examination of peripheral vascular system by observation and palpation is normal  EYES: Ophthalmoscopic exam of optic discs and posterior segments is normal; no papilledema or hemorrhages No results found.  MUSCULOSKELETAL: Gait, strength, tone, movements noted in Neurologic exam below  NEUROLOGIC: MENTAL STATUS:      No data to display         awake, alert, oriented to person, place and time recent and remote memory intact normal attention and concentration language fluent, comprehension intact, naming intact fund of knowledge appropriate  CRANIAL NERVE:  2nd - no papilledema on fundoscopic exam 2nd, 3rd, 4th, 6th - pupils equal and reactive to light, visual fields full to confrontation, extraocular muscles intact, no nystagmus; LEFT PTOSIS FLUCTUATING 5th - facial sensation symmetric 7th - facial strength --> weakness of left eyebrow raise and left smile; able to close left eye (synkinesis on smiling) 8th - hearing intact 9th - palate elevates symmetrically, uvula midline 11th - shoulder shrug symmetric 12th - tongue protrusion midline  MOTOR:  normal bulk and tone, full strength in the BUE, BLE  SENSORY:   normal and symmetric to light touch, temperature, vibration  COORDINATION:  finger-nose-finger, fine finger movements normal  REFLEXES:  deep tendon reflexes TRACE and symmetric  GAIT/STATION:  narrow based gait     DIAGNOSTIC DATA (LABS, IMAGING, TESTING) - I reviewed patient records, labs, notes, testing and imaging myself where available.  Lab Results  Component Value Date   WBC 5.6 09/01/2019   HGB 13.5 09/01/2019   HCT 44.0 09/01/2019   MCV 84 09/01/2019   PLT 306 09/01/2019      Component Value Date/Time   NA 141 09/01/2019 0807   K 4.7 09/01/2019 0807   CL 101 09/01/2019 0807   CO2 29 09/01/2019 0807   GLUCOSE 104 (H) 09/01/2019 0807   GLUCOSE 100 (H) 05/24/2015 1828   BUN 6 09/01/2019 0807   CREATININE 0.98 09/01/2019 0807   CREATININE 0.78 05/24/2015 1828   CALCIUM 9.3 09/01/2019 0807   PROT 7.3 09/01/2019 0807   ALBUMIN 3.9 (L) 09/01/2019 0807   AST 15 09/01/2019 0807   ALT 16 09/01/2019 0807   ALKPHOS 63 09/01/2019 0807   BILITOT 0.3 09/01/2019 0807   GFRNONAA 90 09/01/2019 0807   GFRAA 103 09/01/2019 0807   Lab Results  Component Value Date   CHOL 265 (H) 07/13/2020   HDL 40 07/13/2020   LDLCALC 202 (H) 07/13/2020   TRIG 102 07/13/2020   CHOLHDL 6.6 (H) 07/13/2020   Lab Results  Component Value Date   HGBA1C 6.3 (A) 09/27/2017   No results found for: "VITAMINB12" Lab Results  Component Value Date   TSH 0.922 02/11/2014   01/11/21      Component Ref Range & Units 1 yr ago  AChR Binding Ab, Serum 0.00 - 0.24 nmol/L 0.12     Component Ref Range & Units 1 yr ago  MuSK Antibodies U/mL <1.0   03/12/12 CT chest - No evidence of acute cardiopulmonary disease.  - Specifically, no findings to suggest thymoma.    ASSESSMENT AND PLAN  52 y.o. year old male here with history of ocular myasthenia gravis since approximately 2012; also prior left Bell's palsy.    Dx:  1. Ocular myasthenia ([place])     PLAN:  FLARE UP --> OCULAR  MYASTHENIA GRAVIS (flare up in Feb 2024; h/o OMG since ~2012)  -  extend out of work for next 4 weeks  - continue pyridostigmine '60mg'$  three times a day   - continue low dose prednisone ('20mg'$  daily); if not better in 4 weeks, may need to increase dose  - trial of IVIG if not improving or if generalized symptoms develop  Return in about 3 months (around 07/20/2022) for MyChart visit (15 min).    Penni Bombard, MD [id number], [id number] PM Certified in Neurology, Neurophysiology and Neuroimaging  [facility] [address] [place], [place] [address] [phone]

## 2022-04-19 NOTE — Telephone Encounter (Signed)
Called Brent Cruz and offered him a appointment with Dr. Leta Baptist today at 1:30 pm. Brent Cruz accepted appointmen for today.

## 2022-04-19 NOTE — Telephone Encounter (Signed)
Dr Leta Baptist had a cancellation today at 2 p. Can patient be called to offer this appt

## 2022-04-19 NOTE — Patient Instructions (Signed)
  FLARE UP --> OCULAR MYASTHENIA GRAVIS (flare up in Feb 2024; h/o OMG since ~2012)  - extend out of work for next 4 weeks  - continue pyridostigmine '60mg'$  three times a day   - continue low dose prednisone ('20mg'$  daily); if not better in 4 weeks, may need to increase dose  - trial of IVIG if not improving or if generalized symptoms develop

## 2022-04-24 ENCOUNTER — Ambulatory Visit: Payer: Commercial Managed Care - PPO | Admitting: Diagnostic Neuroimaging

## 2022-04-30 LAB — ACHR ABS WITH REFLEX TO MUSK: AChR Binding Ab, Serum: 0.12 nmol/L (ref 0.00–0.24)

## 2022-04-30 LAB — MUSK ANTIBODIES: MuSK Antibodies: <1 U/mL

## 2022-05-02 ENCOUNTER — Encounter: Payer: Self-pay | Admitting: Neurology

## 2022-05-02 NOTE — Telephone Encounter (Signed)
Called the patient and confirmed that he is ready to return to work. He will be returning on Monday. Letter is written to reflect that date. Will have Dr Leta Baptist sign and will place at front for the pt to pick up. Pt verbalized understanding.

## 2022-05-02 NOTE — Telephone Encounter (Signed)
Pt called. Stated he needs a doctor note stating he is cleared to go back to work.

## 2022-05-14 ENCOUNTER — Telehealth: Payer: Self-pay | Admitting: Diagnostic Neuroimaging

## 2022-05-14 MED ORDER — PREDNISONE 20 MG PO TABS: 20.0000 mg | ORAL_TABLET | Freq: Two times a day (BID) | ORAL | 3 refills | Status: DC

## 2022-05-14 NOTE — Telephone Encounter (Addendum)
Contacted pt back, he stated he was wanting a increase on Prednisone. During last visit he was informed he could increase it but he is running out of medication. He is currently taking 20MG  BID and doing well. Per previous note "continue low dose prednisone (20mg  daily); if not better in 4 weeks, may need to increase dose" I will send a Rx for him to take BID. Pt Is aware MD is out of office, I will make him aware of change upon his return.

## 2022-05-14 NOTE — Telephone Encounter (Signed)
Pt states re: him returning to work on 04-04 and his Rx for pyridostigmine (MESTINON) 60 MG tablet  & predniSONE. He states more often than not he normally takes this medication twice a day.  As a result of that he will run out early on the 30 day scripts for once a day, pt is asking for a revised Rx be sent to pharmacy( [facility] [phone]  ) that will allow him 2 pills a day.

## 2022-05-21 NOTE — Telephone Encounter (Signed)
Pt called stated he is following up on Prednisone being sent to pharmacy.

## 2022-06-05 ENCOUNTER — Telehealth: Payer: Self-pay | Admitting: Diagnostic Neuroimaging

## 2022-06-05 NOTE — Telephone Encounter (Signed)
Sent mychart msg informing pt of appointment change due to provider template change 

## 2022-07-17 ENCOUNTER — Ambulatory Visit: Payer: Commercial Managed Care - PPO | Admitting: Diagnostic Neuroimaging

## 2022-07-17 ENCOUNTER — Encounter: Payer: Self-pay | Admitting: Diagnostic Neuroimaging

## 2022-07-17 VITALS — BP 148/101 | HR 90 | Ht 68.0 in | Wt 341.0 lb

## 2022-07-17 DIAGNOSIS — G7000 Myasthenia gravis without (acute) exacerbation: Secondary | ICD-10-CM

## 2022-07-17 DIAGNOSIS — R0681 Apnea, not elsewhere classified: Secondary | ICD-10-CM

## 2022-07-17 DIAGNOSIS — R0683 Snoring: Secondary | ICD-10-CM | POA: Diagnosis not present

## 2022-07-17 MED ORDER — PREDNISONE 20 MG PO TABS: 20.0000 mg | ORAL_TABLET | Freq: Every day | ORAL | 4 refills | Status: DC

## 2022-07-17 MED ORDER — PYRIDOSTIGMINE BROMIDE 60 MG PO TABS: 60.0000 mg | ORAL_TABLET | Freq: Three times a day (TID) | ORAL | 4 refills | Status: AC

## 2022-07-17 NOTE — Progress Notes (Signed)
[facility]  PATIENT: Brent Cruz DOB: Dec 07, 1970  REFERRING CLINICIAN: Fleet Contras, MD HISTORY FROM: patient  REASON FOR VISIT: follow up   HISTORICAL  CHIEF COMPLAINT:  Chief Complaint  Patient presents with   Follow-up    Rm 7 alone Pt is well and stable, no new concerns since visit. Medications does help but he has been running out.     HISTORY OF PRESENT ILLNESS:   UPDATE (07/17/22, VRP): Since last visit, doing well with ocular myasthenia gravis. Tolerating meds. Asking about sleep study (was done 1-2 years ago with PCP; lost to follow up). No generalized MG symptoms.  UPDATE (04/19/22, VRP): Since last visit, doing about the same. Now on prednisone 20mg  daily. Left ptosis continues to fluctuate. No generalized symptoms.   UPDATE (03/27/22, VRP): Since last visit, doing well until last week; new onset of left ptosis, fluctuating. On pyridostigmine 60mg  twice a day. No issues with arms, legs, swallowing, breathing.   UPDATE (06/21/21, VRP): Since last visit, doing well until last week, then having more right sided ptosis. No double vision. No speech, swallowing issues. Now back on mestinon and sxs improving.   PRIOR HPI (01/11/21): 52 year old male here for evaluation of myasthenia gravis.  Patient had onset of left facial weakness around 2000-2010 (?), diagnosed with Bell's palsy.  Over time he had some recurrence of symptoms and ultimately was diagnosed with ocular myasthenia gravis in addition.  He was started on prednisone and Mestinon around 2012-2014.  He had variable follow-up in neurology clinic related to insurance status, finances and compliance.  Over the years he would take prednisone on and Mestinon intermittently for few days or few weeks at a time based on his symptoms.  Last seen by Dr. Anne Hahn in this clinic in July 2021.  No prior generalized or bulbar symptoms.  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  Patient denies any recurrence of symptoms lately.  He has some mild intermittent left ptosis and blurred vision.  PRIOR HPI (03/30/14, Dr. Anne Hahn): 52 year old right-handed white male with a history of myasthenia gravis. He has had a left-sided Bell's palsy, but he developed recurring weakness and was seen in January 2014. The patient was found to have elevated acetylcholine receptor antibody levels, and a CT scan of the chest was unremarkable. The patient placed on prednisone and Mestinon, but he was lost to follow-up. The patient apparently has not been on any medication for least a year or year and a half. He has done quite well until about 2 weeks ago when he had recurrence of left facial weakness. He has had ptosis of the left eye that may close that eye. The patient denies any overt double vision. He denies any weakness of the extremities. He has not had any weakness with chewing, or change in speech or swallowing. He returns for an evaluation. He is back on prednisone, currently on 20 mg daily. The prednisone has improved his symptoms.   REVIEW OF SYSTEMS: Full 14 system review of systems performed and negative with exception of: As per HPI.  ALLERGIES: No Known Allergies  HOME MEDICATIONS: Outpatient Medications Prior to Visit  Medication Sig Dispense Refill   amLODipine (NORVASC) 5 MG tablet Take 5 mg by mouth daily.     atorvastatin (LIPITOR) 20 MG tablet Take 20  mg by mouth at bedtime.     lisinopril (ZESTRIL) 20 MG tablet Take 1 tablet (20 mg total) by mouth daily. 30 tablet 3   Vitamin D, Ergocalciferol, (DRISDOL) 1.25 MG (50000 UNIT) CAPS capsule Take 50,000 Units by mouth once a week.     predniSONE (DELTASONE) 20 MG tablet Take 1 tablet (20 mg total) by mouth 2 (two) times daily with a meal. 60 tablet 3   pyridostigmine (MESTINON) 60 MG tablet  TAKE 1/2 TO 1 (ONE-HALF TO ONE) TABLET BY MOUTH THREE TIMES DAILY AS NEEDED 60 tablet 0   No facility-administered medications prior to visit.    PAST MEDICAL HISTORY: Past Medical History:  Diagnosis Date   Asthma    as a child   Bell's palsy    Recurrent   COVID-19 10/2018   Hypertension    Myasthenia gravis with exacerbation ([facility]) 03/30/2014   Obesity     PAST SURGICAL HISTORY: Past Surgical History:  Procedure Laterality Date    OPEN VENTRAL HERNIA REPAIR WITH MESH (N/A Abdomen)  04/26/2020   COLONOSCOPY WITH PROPOFOL N/A 09/12/2020   Procedure: COLONOSCOPY WITH PROPOFOL;  Surgeon: Lemar Lofty., MD;  Location: [facility];  Service: Gastroenterology;  Laterality: N/A;   POLYPECTOMY  09/12/2020   Procedure: POLYPECTOMY;  Surgeon: Mansouraty, Netty Starring., MD;  Location: [facility];  Service: Gastroenterology;;   tonsil abscess     VENTRAL HERNIA REPAIR N/A 04/26/2020   Procedure: OPEN VENTRAL HERNIA REPAIR WITH MESH;  Surgeon: Diamantina Monks, MD;  Location: [facility];  Service: General;  Laterality: N/A;    FAMILY HISTORY: Family History  Problem Relation Age of Onset   Healthy Mother    Glaucoma Father    Diabetes Paternal Grandfather    Colon cancer Neg Hx    Esophageal cancer Neg Hx    Inflammatory bowel disease Neg Hx    Liver disease Neg Hx    Pancreatic cancer Neg Hx    Rectal cancer Neg Hx    Stomach cancer Neg Hx     SOCIAL HISTORY: Social History   Socioeconomic History   Marital status: Single    Spouse name: Not on file   Number of children: 2   Years of education: College   Highest education level: Not on file  Occupational History   Occupation: Driver [employer]  Tobacco Use   Smoking status: Former    Types: Cigarettes    Quit date: 02/12/1989    Years since quitting: 33.4   Smokeless tobacco: Never  Vaping Use   Vaping Use: Never used  Substance and Sexual Activity   Alcohol use: Yes    Comment: Consumes alcohol on special occasion    Drug use: No   Sexual activity: Not on file  Other Topics Concern   Not on file  Social History Narrative   Patient is right handed.   Patient does not drink caffeine.   Social Determinants of Health   Financial Resource Strain: Not on file  Food Insecurity: Not on file  Transportation Needs: Not on file  Physical Activity: Not on file  Stress: Not on file  Social Connections: Not on file  Intimate Partner Violence: Not on file     PHYSICAL EXAM  GENERAL EXAM/CONSTITUTIONAL: Vitals:  Vitals:   07/17/22 1415 07/17/22 1421  BP: (!) 167/104 (!) 148/101  Pulse: 87 90  Weight: (!) 341 lb (154.7 kg)   Height: 5\' 8"  (1.727 m)     Body mass index is  51.85 kg/m. Wt Readings from Last 3 Encounters:  07/17/22 (!) 341 lb (154.7 kg)  04/19/22 (!) 334 lb 3.2 oz (151.6 kg)  03/27/22 (!) 334 lb 9.6 oz (151.8 kg)   Patient is in no distress; well developed, nourished and groomed; neck is supple  CARDIOVASCULAR: Examination of carotid arteries is normal; no carotid bruits Regular rate and rhythm, no murmurs Examination of peripheral vascular system by observation and palpation is normal  EYES: Ophthalmoscopic exam of optic discs and posterior segments is normal; no papilledema or hemorrhages No results found.  MUSCULOSKELETAL: Gait, strength, tone, movements noted in Neurologic exam below  NEUROLOGIC: MENTAL STATUS:      No data to display         awake, alert, oriented to person, place and time recent and remote memory intact normal attention and concentration language fluent, comprehension intact, naming intact fund of knowledge appropriate  CRANIAL NERVE:  2nd - no papilledema on fundoscopic exam 2nd, 3rd, 4th, 6th - pupils equal and reactive to light, visual fields full to confrontation, extraocular muscles intact, no nystagmus; LEFT PTOSIS FLUCTUATING 5th - facial sensation symmetric 7th - facial strength --> weakness of left eyebrow raise and left smile;  able to close left eye (synkinesis on smiling) 8th - hearing intact 9th - palate elevates symmetrically, uvula midline 11th - shoulder shrug symmetric 12th - tongue protrusion midline  MOTOR:  normal bulk and tone, full strength in the BUE, BLE  SENSORY:  normal and symmetric to light touch, temperature, vibration  COORDINATION:  finger-nose-finger, fine finger movements normal  REFLEXES:  deep tendon reflexes TRACE and symmetric  GAIT/STATION:  narrow based gait     DIAGNOSTIC DATA (LABS, IMAGING, TESTING) - I reviewed patient records, labs, notes, testing and imaging myself where available.  Lab Results  Component Value Date   WBC 5.6 09/01/2019   HGB 13.5 09/01/2019   HCT 44.0 09/01/2019   MCV 84 09/01/2019   PLT 306 09/01/2019      Component Value Date/Time   NA 141 09/01/2019 0807   K 4.7 09/01/2019 0807   CL 101 09/01/2019 0807   CO2 29 09/01/2019 0807   GLUCOSE 104 (H) 09/01/2019 0807   GLUCOSE 100 (H) 05/24/2015 1828   BUN 6 09/01/2019 0807   CREATININE 0.98 09/01/2019 0807   CREATININE 0.78 05/24/2015 1828   CALCIUM 9.3 09/01/2019 0807   PROT 7.3 09/01/2019 0807   ALBUMIN 3.9 (L) 09/01/2019 0807   AST 15 09/01/2019 0807   ALT 16 09/01/2019 0807   ALKPHOS 63 09/01/2019 0807   BILITOT 0.3 09/01/2019 0807   GFRNONAA 90 09/01/2019 0807   GFRAA 103 09/01/2019 0807   Lab Results  Component Value Date   CHOL 265 (H) 07/13/2020   HDL 40 07/13/2020   LDLCALC 202 (H) 07/13/2020   TRIG 102 07/13/2020   CHOLHDL 6.6 (H) 07/13/2020   Lab Results  Component Value Date   HGBA1C 6.3 (A) 09/27/2017   No results found for: "VITAMINB12" Lab Results  Component Value Date   TSH 0.922 02/11/2014   01/11/21      Component Ref Range & Units 1 yr ago  AChR Binding Ab, Serum 0.00 - 0.24 nmol/L 0.12     Component Ref Range & Units 1 yr ago  MuSK Antibodies U/mL <1.0   03/12/12 CT chest - No evidence of acute cardiopulmonary disease.  - Specifically, no  findings to suggest thymoma.    ASSESSMENT AND PLAN  52 y.o. year old  male here with history of ocular myasthenia gravis since approximately 2012; also prior left Bell's palsy.    Dx:  1. Ocular myasthenia ([facility])   2. Apnea   3. Snoring   4. Morbid obesity ([facility])      PLAN:  OCULAR MYASTHENIA GRAVIS (prior elevated AchR levels in past; now normal; flare up in Feb 2024; h/o OMG since ~2012; now stable) - continue pyridostigmine 60mg  three times a day  - continue low dose prednisone 20mg  daily - in future trial of IVIG if not improving or if generalized symptoms develop  SNORING, FATIGUE, OBESITY - eval for OSA; prior sleep study in 2022?  Meds ordered this encounter  Medications   predniSONE (DELTASONE) 20 MG tablet    Sig: Take 1 tablet (20 mg total) by mouth daily with breakfast.    Dispense:  90 tablet    Refill:  4   pyridostigmine (MESTINON) 60 MG tablet    Sig: Take 1 tablet (60 mg total) by mouth 3 (three) times daily.    Dispense:  270 tablet    Refill:  4   Orders Placed This Encounter  Procedures   Ambulatory referral to Sleep Studies   Return in about 9 months (around 04/16/2023).    Suanne Marker, MD 07/17/2022, 2:59 PM Certified in Neurology, Neurophysiology and Neuroimaging  [facility] [address], [address] [place], [place] [address] [phone]

## 2022-07-26 ENCOUNTER — Ambulatory Visit: Payer: Commercial Managed Care - PPO | Admitting: Diagnostic Neuroimaging

## 2022-08-22 ENCOUNTER — Ambulatory Visit: Payer: Commercial Managed Care - PPO | Admitting: Adult Health

## 2022-08-27 ENCOUNTER — Encounter: Payer: Self-pay | Admitting: Neurology

## 2022-08-27 ENCOUNTER — Ambulatory Visit: Payer: Commercial Managed Care - PPO | Admitting: Neurology

## 2022-08-27 VITALS — BP 161/89 | HR 89 | Ht 65.0 in | Wt 343.0 lb

## 2022-08-27 DIAGNOSIS — G7000 Myasthenia gravis without (acute) exacerbation: Secondary | ICD-10-CM

## 2022-08-27 DIAGNOSIS — R351 Nocturia: Secondary | ICD-10-CM

## 2022-08-27 DIAGNOSIS — G4719 Other hypersomnia: Secondary | ICD-10-CM

## 2022-08-27 DIAGNOSIS — R0683 Snoring: Secondary | ICD-10-CM

## 2022-08-27 DIAGNOSIS — R030 Elevated blood-pressure reading, without diagnosis of hypertension: Secondary | ICD-10-CM

## 2022-08-27 DIAGNOSIS — Z6841 Body Mass Index (BMI) 40.0 and over, adult: Secondary | ICD-10-CM

## 2022-08-27 DIAGNOSIS — Z9189 Other specified personal risk factors, not elsewhere classified: Secondary | ICD-10-CM

## 2022-08-27 NOTE — Progress Notes (Signed)
Subjective:    Patient ID: Brent Cruz is a 52 y.o. male.  HPI    Brent Foley, MD, PhD [facility] [address], [address] [address] [place], [place] [address]  Dear Satira Sark,   I saw your patient, Brent Cruz, upon your kind request in my sleep clinic today for initial consultation of his sleep disorder, in particular, concern for underlying obstructive sleep apnea.  The patient is unaccompanied today.  As you know, Brent Cruz is a 52 year old male with an underlying medical history of asthma, Bell's palsy, ocular myasthenia, hypertension, and morbid obesity with a BMI of over 50, who reports snoring and excessive daytime somnolence.  His Epworth sleepiness score is.  I reviewed your office note from 07/17/2022.  His blood pressure was elevated at the time but at 148/101. BP is 161/89 today.  He reports doing a home sleep test a couple of years ago through PCP but did not receive the results, reports that he was supposed to get a CPAP machine through [facility] but never got 1.  He would be willing to get tested and in fact is recommended to have a sleep study by his DOT physician.  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 He lives with his mom, dad, sister and niece.  He has 2 grown children, 1 son, 1 daughter.  Bedtime is generally between 2 and 3 AM and rise time between 9 and 10 AM.  He works second shift, typically from 1 or 2 PM to 10 PM.  He is off Thursdays and Fridays.  He does have a TV on in his bedroom at night, tries to turn it off before falling asleep or it turns off on a sleep timer.  He has significant nocturia about 2-3 times per average night but denies recurrent morning headaches.  He is not aware of any family history of sleep apnea.  Weight has increased over time, especially after he started prednisone.  He drinks caffeine in the form of sweet tea, 1 large cup per day on average.  He quit smoking at age 61, he drinks alcohol infrequently, on special occasions  typically.  His Past Medical History Is Significant For: Past Medical History:  Diagnosis Date   Asthma    as a child   Bell's palsy    Recurrent   COVID-19 10/2018   Hypertension    Myasthenia gravis with exacerbation (HCC) 03/30/2014   Obesity     His Past Surgical History Is Significant For: Past Surgical History:  Procedure Laterality Date    OPEN VENTRAL HERNIA REPAIR WITH MESH (N/A Abdomen)  04/26/2020   COLONOSCOPY WITH PROPOFOL N/A 09/12/2020   Procedure: COLONOSCOPY WITH PROPOFOL;  Surgeon: Lemar Lofty., MD;  Location: [facility];  Service: Gastroenterology;  Laterality: N/A;   POLYPECTOMY  09/12/2020   Procedure: POLYPECTOMY;  Surgeon: Mansouraty, Netty Starring., MD;  Location: [facility];  Service: Gastroenterology;;   tonsil abscess     VENTRAL HERNIA REPAIR N/A 04/26/2020   Procedure: OPEN VENTRAL HERNIA REPAIR WITH MESH;  Surgeon: Diamantina Monks, MD;  Location: [facility] OR;  Service: General;  Laterality: N/A;    His Family History Is Significant For: Family History  Problem Relation Age of Onset   Healthy Mother    Glaucoma Father    Diabetes Paternal Grandfather    Colon cancer Neg Hx    Esophageal cancer Neg Hx    Inflammatory bowel disease Neg Hx    Liver disease Neg Hx  Pancreatic cancer Neg Hx    Rectal cancer Neg Hx    Stomach cancer Neg Hx    Sleep apnea Neg Hx     His Social History Is Significant For: Social History   Socioeconomic History   Marital status: Single    Spouse name: Not on file   Number of children: 2   Years of education: College   Highest education level: Not on file  Occupational History   Occupation: Driver [employer]  Tobacco Use   Smoking status: Former    Current packs/day: 0.00    Types: Cigarettes    Quit date: 02/12/1989    Years since quitting: 33.5   Smokeless tobacco: Never  Vaping Use   Vaping status: Never Used  Substance and Sexual Activity   Alcohol use: Yes    Comment: Consumes alcohol on special  occasion   Drug use: No   Sexual activity: Not on file  Other Topics Concern   Not on file  Social History Narrative   Patient is right handed.   Patient does not drink caffeine.   Social Determinants of Health   Financial Resource Strain: Not on file  Food Insecurity: Not on file  Transportation Needs: Not on file  Physical Activity: Not on file  Stress: Not on file  Social Connections: Not on file    His Allergies Are:  No Known Allergies:   His Current Medications Are:  Outpatient Encounter Medications as of 08/27/2022  Medication Sig   amLODipine (NORVASC) 5 MG tablet Take 5 mg by mouth daily.   atorvastatin (LIPITOR) 20 MG tablet Take 20 mg by mouth at bedtime.   lisinopril (ZESTRIL) 20 MG tablet Take 1 tablet (20 mg total) by mouth daily.   predniSONE (DELTASONE) 20 MG tablet Take 1 tablet (20 mg total) by mouth daily with breakfast.   pyridostigmine (MESTINON) 60 MG tablet Take 1 tablet (60 mg total) by mouth 3 (three) times daily.   Vitamin D, Ergocalciferol, (DRISDOL) 1.25 MG (50000 UNIT) CAPS capsule Take 50,000 Units by mouth once a week. (Patient not taking: Reported on 08/27/2022)   No facility-administered encounter medications on file as of 08/27/2022.  :   Review of Systems:  Out of a complete 14 point review of systems, all are reviewed and negative with the exception of these symptoms as listed below:  Review of Systems  Neurological:        Pt here for sleep consult Pt snores,hypertension,fatigue  Pt denies headaches. Pt states PCP gave HST  but never received results. Pt states doesn't remember when HST was done    ESS:12 FSS:40    Objective:  Neurological Exam  Physical Exam Physical Examination:   Vitals:   08/27/22 1229  BP: (!) 161/89  Pulse: 89    General Examination: The patient is a very pleasant 52 y.o. male in no acute distress. He appears well-developed and well-nourished and well groomed.   HEENT: Normocephalic, atraumatic,  pupils are equal, round and reactive to light, mild bilateral ptosis, right more than left.  Mild left facial weakness noted with intermittent midface twitching noted.  Speech is clear with no dysarthria noted. There is no hypophonia. There is no lip, neck/head, jaw or voice tremor. Neck is supple with full range of passive and active motion. There are no carotid bruits on auscultation. Oropharynx exam reveals: mild mouth dryness, adequate dental hygiene and marked airway crowding, due to thicker soft palate, larger uvula, tonsillar size of 2-3+ bilaterally, thicker tongue.  Mallampati class III.  Neck circumference 20 3/8 inches, no significant overbite, incomplete occlusion with slight crossbite noted.  Tongue protrudes centrally and palate elevates symmetrically. Wears dark eyeglasses for most of the visit.  Chest: Clear to auscultation without wheezing, rhonchi or crackles noted.  Heart: S1+S2+0, regular and normal without murmurs, rubs or gallops noted.   Abdomen: Soft, non-tender and non-distended.  Extremities: There is 1+ pitting edema in the distal lower extremities bilaterally.   Skin: Warm and dry without trophic changes noted.   Musculoskeletal: exam reveals no obvious joint deformities.   Neurologically:  Mental status: The patient is awake, alert and oriented in all 4 spheres. His immediate and remote memory, attention, language skills and fund of knowledge are appropriate. There is no evidence of aphasia, agnosia, apraxia or anomia. Speech is clear with normal prosody and enunciation. Thought process is linear. Mood is normal and affect is normal.  Cranial nerves II - XII are as described above under HEENT exam.  Motor exam: Normal bulk, strength and tone is noted. There is no obvious action or resting tremor.  Fine motor skills and coordination: grossly intact.  Cerebellar testing: No dysmetria or intention tremor. There is no truncal or gait ataxia.  Sensory exam: intact to  light touch in the upper and lower extremities.  Gait, station and balance: He stands easily. No veering to one side is noted. No leaning to one side is noted. Posture is age-appropriate and stance is narrow based. Gait shows normal stride length and normal pace. No problems turning are noted.   Assessment and Plan:  In summary, Brent Cruz is a very pleasant 52 y.o.-year old male with an underlying medical history of asthma, Bell's palsy, ocular myasthenia, hypertension, and morbid obesity with a BMI of over 50, whose history and physical exam are concerning for sleep disordered breathing, particularly obstructive sleep apnea (OSA). A laboratory attended sleep study is typically considered "gold standard" for evaluation of sleep disordered breathing.   I had a long chat with the patient about my findings and the diagnosis of sleep apnea, particularly OSA, its prognosis and treatment options. We talked about medical/conservative treatments, surgical interventions and non-pharmacological approaches for symptom control. I explained, in particular, the risks and ramifications of untreated moderate to severe OSA, especially with respect to developing cardiovascular disease down the road, including congestive heart failure (CHF), difficult to treat hypertension, cardiac arrhythmias (particularly A-fib), neurovascular complications including TIA, stroke and dementia. Even type 2 diabetes has, in part, been linked to untreated OSA. Symptoms of untreated OSA may include (but may not be limited to) daytime sleepiness, nocturia (i.e. frequent nighttime urination), memory problems, mood irritability and suboptimally controlled or worsening mood disorder such as depression and/or anxiety, lack of energy, lack of motivation, physical discomfort, as well as recurrent headaches, especially morning or nocturnal headaches. We talked about the importance of maintaining a healthy lifestyle and striving for healthy weight.  In  addition, we talked about the importance of striving for and maintaining good sleep hygiene. I recommended a sleep study at this time. I outlined the differences between a laboratory attended sleep study which is considered more comprehensive and accurate over the option of a home sleep test (HST); the latter may lead to underestimation of sleep disordered breathing in some instances and does not help with diagnosing upper airway resistance syndrome and is not accurate enough to diagnose primary central sleep apnea typically. I outlined possible surgical and non-surgical treatment options of OSA, including the use  of a positive airway pressure (PAP) device (i.e. CPAP, AutoPAP/APAP or BiPAP in certain circumstances), a custom-made dental device (aka oral appliance, which would require a referral to a specialist dentist or orthodontist typically, and is generally speaking not considered for patients with full dentures or edentulous state), upper airway surgical options, such as traditional UPPP (which is not considered a first-line treatment) or the Inspire device (hypoglossal nerve stimulator, which would involve a referral for consultation with an ENT surgeon, after careful selection, following inclusion criteria - also not first-line treatment). I explained the PAP treatment option to the patient in detail, as this is generally considered first-line treatment.  The patient indicated that he would be willing to try PAP therapy, if the need arises. I explained the importance of being compliant with PAP treatment, not only for insurance purposes but primarily to improve patient's symptoms symptoms, and for the patient's long term health benefit, including to reduce His cardiovascular risks longer-term.    We will pick up our discussion about the next steps and treatment options after testing.  We will keep him posted as to the test results by phone call and/or MyChart messaging where possible.  We will plan to  follow-up in sleep clinic accordingly as well.  I answered all his questions today and the patient was in agreement.   I encouraged him to call with any interim questions, concerns, problems or updates or email [place] through MyChart.  Generally speaking, sleep test authorizations may take up to 2 weeks, sometimes less, sometimes longer, the patient is encouraged to get in touch with [place] if they do not hear back from the sleep lab staff directly within the next 2 weeks.  Thank you very much for allowing me to participate in the care of this nice patient. If I can be of any further assistance to you please do not hesitate to call me at [phone].  Sincerely,   Brent Foley, MD, PhD

## 2022-08-27 NOTE — Patient Instructions (Signed)

## 2022-10-08 ENCOUNTER — Telehealth: Payer: Self-pay | Admitting: Neurology

## 2022-10-08 NOTE — Telephone Encounter (Signed)
NPS[employer]- UMR no auth req ref # Brent Cruz on 09/26/22   Patient is scheduled at [facility] for 12/17/22 at  pm.  I also informed him I will put him on our cancellation list.  Mailed packet to the patient.

## 2022-12-17 ENCOUNTER — Ambulatory Visit (INDEPENDENT_AMBULATORY_CARE_PROVIDER_SITE_OTHER): Payer: Commercial Managed Care - PPO | Admitting: Neurology

## 2022-12-17 DIAGNOSIS — G7000 Myasthenia gravis without (acute) exacerbation: Secondary | ICD-10-CM

## 2022-12-17 DIAGNOSIS — G4720 Circadian rhythm sleep disorder, unspecified type: Secondary | ICD-10-CM

## 2022-12-17 DIAGNOSIS — G4734 Idiopathic sleep related nonobstructive alveolar hypoventilation: Secondary | ICD-10-CM

## 2022-12-17 DIAGNOSIS — G4733 Obstructive sleep apnea (adult) (pediatric): Secondary | ICD-10-CM

## 2022-12-17 DIAGNOSIS — R0683 Snoring: Secondary | ICD-10-CM

## 2022-12-17 DIAGNOSIS — R351 Nocturia: Secondary | ICD-10-CM

## 2022-12-17 DIAGNOSIS — R030 Elevated blood-pressure reading, without diagnosis of hypertension: Secondary | ICD-10-CM

## 2022-12-17 DIAGNOSIS — Z6841 Body Mass Index (BMI) 40.0 and over, adult: Secondary | ICD-10-CM

## 2022-12-17 DIAGNOSIS — G4719 Other hypersomnia: Secondary | ICD-10-CM

## 2022-12-17 DIAGNOSIS — Z9189 Other specified personal risk factors, not elsewhere classified: Secondary | ICD-10-CM

## 2022-12-20 ENCOUNTER — Telehealth: Payer: Self-pay | Admitting: *Deleted

## 2022-12-20 NOTE — Telephone Encounter (Signed)
Spoke to pt made him aware he was urgent set up due to severe OSA  Gave Dr Frances Furbish recommendations Pt chose [facility] for DME Gave pt [employer] # Pt aware of insurance compliance  Made pt an f/u appointment with Marjorie Smolder 02/2023 Sent referring MD sleep study results and sent community message to [employer] [facility] to make them aware of orders and  urgent set for pt  Pt expressed understanding and thanked me for calling

## 2022-12-20 NOTE — Addendum Note (Signed)
Addended by: [name] on: 12/20/2022 11:05 AM   Modules accepted: Orders

## 2022-12-20 NOTE — Telephone Encounter (Signed)
-----   Message from Brent Cruz sent at 12/20/2022 11:05 AM EST ----- Urgent set up requested on PAP therapy, due to severe OSA. Patient referred by Dr. Marjory Lies, seen by me on 08/27/22, patient had a split night sleep study on 12/17/22. Please call and notify patient that the recent sleep study showed severe obstructive sleep apnea (OSA) with very severe oxygen drops. He did fairly well with a machine called BiPAP during the study with significant improvement of the respiratory events. I would like start the patient on a new BiPAP machine for home use. I placed the order in the chart.  Please advise patient that we will need a follow up appointment with either myself or one of our nurse practitioners in about 2-3 months post set-up to check for how they are doing on treatment and how well it's going with the machine in general. Most insurance company require a certain compliance percentage to continue to cover/pay for the machine. Please ask patient to schedule this FU appointment, according to the set-up date, which is the day they receive the machine. Please make sure, the patient understands the importance of keeping this window for the FU appointment, as it is often an Barista and not our rule. Failing to adhere to this may result in losing coverage for sleep apnea treatment, at which point some insurances require repeating the whole process. Plus, monitoring compliance data is usually good feedback for the patient as far as how they are doing, how many hours they are on it, how well the mask fits, etc.  Also remind patient, that any PAP machine or mask issues should be first addressed with the DME company, who provided the machine/supplies.  Please ask if patient has a preference regarding DME company, may depend on the insurance too.  I strongly advise patient to continue to work on weight loss aggressively. If he has not seen a medical weight management provider, he can ask his PCP for a  referral or we can place a referral if he would like. I also recommend he try to sleep on his sides as much as possible. While waiting on his BiPAP machine, I recommend he sleep with his head of bed elevated to about 30 degrees.   Brent Foley, MD, PhD [facility])

## 2022-12-20 NOTE — Procedures (Signed)
Physician Interpretation:     [facility] SPLIT NIGHT INTERPRETATION REPORT   STUDY DATE: 12/17/2022     PATIENT NAME:  Brent Cruz, Brent Cruz         DATE OF BIRTH:  1970/03/27  PATIENT ID:  [id number]    TYPE OF STUDY:  SPLIT  READING PHYSICIAN: [name], MD, PhD SCORING TECHNICIAN: Sheena Fields   Referred by: Dr. Joycelyn Brent Cruz ? History and Indication for Testing: 52 year old male with an underlying medical history of asthma, Bell's palsy, ocular myasthenia, hypertension, and morbid obesity with a BMI of over 50, who reports snoring and excessive daytime somnolence. His Epworth sleepiness score is 12 out of 24, fatigue severity score is 40 out of 63.  Height: 65.0 in Weight: 343 lb (BMI 57) Neck Size: 20.3 in.    Medications: Norvasc, Lipitor, Zestril, Deltasone, Mesitonon, Vitamin D, Drisdol  DESCRIPTION: A sleep technologist was in attendance for the duration of the recording.  Data collection, scoring, video monitoring, and reporting were performed in compliance with the AASM Manual for the Scoring of Sleep and Associated Events; (Hypopnea is scored based on the criteria listed in Section VIII D. 1b in the AASM Manual V2.6 using a 4% oxygen desaturation rule or Hypopnea is scored based on the criteria listed in Section VIII D. 1a in the AASM Manual V2.6 using 3% oxygen desaturation and /or arousal rule).  A physician certified by the American Board of Sleep Medicine reviewed each epoch of the study.   FINDINGS:  Please refer to the attached summary for additional quantitative information. STUDY DETAILS: Lights off was at 22:02: and lights on 05:00: (394 minutes hours in bed). This study was performed with an initial diagnostic portion followed by positive airway pressure titration.    DIAGNOSTIC ANALYSIS   SLEEP CONTINUITY AND SLEEP ARCHITECTURE:  The diagnostic portion of the study began at 22:02 and ended at 23:31, for a recording time of 1h 4.49m  minutes.  Total sleep time was 40 minutes minutes (100.0% supine;  0.0% lateral;  0.0% prone, 0.0% REM sleep), with a decreased sleep efficiency at 62.0%. Sleep latency was normal at 9.5 minutes. Arousal index was 102.0 /hr. Of the total sleep time, the percentage of stage N1 sleep was 6.3%, stage N2 sleep was 93.8%, which is increased, stage N3 sleep and REM sleep were absent. Wake after sleep onset (WASO) time accounted for 15 minutes with moderate sleep fragmentation noted.   AROUSAL (Baseline): There were 62.0 arousals in total, for an arousal index of 93.0 arousals/hour.  Of these, 65.0 were identified as respiratory-related arousals (97.5 /hr), 0 were PLM-related arousals (0.0 /hr), and 3 were non-specific arousals (4.5 /hr)   RESPIRATORY MONITORING:  Based on CMS criteria (using a 4% oxygen desaturation rule for scoring hypopneas), there were 168 apneas (166 obstructive; 1 central; 1 mixed), and 39 hypopneas.  Apnea index was 103.5. Hypopnea index was 12.0. The apnea-hypopnea index was 115.5 overall (115.5 supine; 0.0 REM, 0.0 supine REM). There were 0 respiratory effort-related arousals (RERAs).  The RERA index was 0.0 events/hr. Total respiratory disturbance index (RDI) was 115.5 events/hr. RDI results showed: supine RDI  115.5 /hr; non-supine RDI 0.0 /hr; REM RDI 0.0 /hr, supine REM RDI 0.0 /hr.  Based on AASM criteria (using a 3% oxygen desaturation and /or arousal rule for scoring hypopneas), there were 168 apneas (166 obstructive; 1 central; 1 mixed), and 39 hypopneas.  Apnea index was 103.5. Hypopnea index was 12.0. The apnea-hypopnea index was  115.5/hour overall (115.5 supine). There were 0 respiratory effort-related arousals (RERAs). Total respiratory disturbance index (RDI) was 115.5 events/hr. RDI results showed: supine RDI 115.5 /hr; non-supine RDI 0.0 /hr; REM RDI 0.0 /hr, supine REM RDI 0.0 /hr.   Respiratory events were associated with oxyhemoglobin desaturations (nadir 53%) from a  normal baseline (mean 89%).      LIMB MOVEMENTS: There were 0 periodic limb movements of sleep (0.0/hr), of which 0 (0.0/hr) were associated with an arousal.     OXIMETRY: Total sleep time spent at, or below 88% was 34.5 minutes, or 86.1% of total sleep time. Snoring was classified as .     BODY POSITION: Duration of total sleep and percent of total sleep in their respective position is as follows: supine 40 minutes minutes (100.0%), non-supine 0.0 minutes (0.0%); right 00 minutes minutes (0.0%), left 00 minutes minutes (0.0%), and prone 00 minutes minutes (0.0%). Total supine REM sleep time was 00 minutes minutes (0.0% of total REM sleep).   Analysis of electrocardiogram activity showed the highest heart rate for the baseline portion of the study was 83.0 beats per minute.  The average heart rate during sleep was 73 bpm, while the highest heart rate for the same period was 78 bpm.      TREATMENT ANALYSIS SLEEP CONTINUITY AND SLEEP ARCHITECTURE:  The treatment portion of the study began at 23:31 and ended at 05:00, for a recording time of 5h 29.33m minutes.  Total sleep time was 250 minutes minutes (17.4% supine;  82.6% lateral; 0.0% prone, 48.2% REM sleep), with a decreased sleep efficiency at 75.9%. Sleep latency was normal at 22.5 minutes. REM sleep latency was normal at 67.0 minutes.  Arousal index was 28.3 /hr. Of the total sleep time, the percentage of stage N1 sleep was 4.6%, stage N3 sleep was 12.8%, and REM sleep was 48.2%. There were 3 Stage R periods observed during this portion of the study, 25 awakenings (i.e. transitions to Stage W from any sleep stage), and 72.0 total stage transitions. Wake after sleep onset (WASO) time accounted for 57 minutes.   AROUSAL: There were 102.0 arousals in total, for an arousal index of 24.5 arousals/hour.  Of these, 97.0 were identified as respiratory-related arousals (23.3 /hr), 0 were PLM-related arousals (0.0 /hr), and 21 were non-specific arousals  (5.0 /hr)  RESPIRATORY MONITORING:    While on PAP therapy, based on CMS criteria, the apnea-hypopnea index was 31.2 overall (13.4 supine; 6.2 REM).   While on PAP therapy, based on AASM criteria, the apnea-hypopnea index was 36.0 overall (13.7 supine; 9.1 REM).   Respiratory events were associated with oxyhemoglobin desaturation (nadir 50.0%) from a mean of 93.0%.  Total time spent at, or below 88% was 112.9 minutes, or 45.2%  of total sleep time.  Snoring was absent:  . There were 0.0 occurrences of Cheyne Stokes breathing. LIMB MOVEMENTS: There were 0 periodic limb movements of sleep (0.0/hr), of which 0 (0.0/hr) were associated with an arousal.     OXIMETRY: Total sleep time spent at, or below 88% was 87.7 minutes, or 35.1% of total sleep time.      BODY POSITION: Duration of total sleep and percent of total sleep in their respective position is as follows: supine 43 minutes minutes (17.4%), non-supine 206.5 minutes (82.6%); right 185 minutes minutes (74.2%), left 21 minutes minutes (8.4%), and prone 00 minutes minutes (0.0%). Total supine REM sleep time was 00 minutes minutes (0.0% of total REM sleep).   Analysis of electrocardiogram activity  showed the highest heart rate for the treatment portion of the study was 91.0 beats per minute.  The average heart rate during sleep was 66 bpm, while the highest heart rate for the same period was 87 bpm.    TITRATION DETAILS (SEE ALSO TABLE AT THE END OF THE REPORT):  The patient qualified for an emergency split study due to severe sleep disordered breathing with an AHI of 115.5/h and O2 nadir 53%.  He was split to 14 hours of baseline testing time to the critical severity of his sleep apnea.  The patient was shown several different interfaces and was subsequently fitted with a large Evora fullface mask from [employer].  The patient was started on a pressure of 7 cm (as he felt that the pressure of 5 cm was not enough) with EPR of 1 and gradually  titrated on CPAP to 17 cm.  However, he still had severe sleep disordered breathing and severe desaturations on high CPAP settings.  Due to the high pressure requirement he was switched to standard BiPAP therapy of 19/15 cm and further titrated to a final BiPAP of 24/20 cm.  He had residual sleep disordered breathing but events were much improved on a BiPAP of 23/19 cm with non-supine REM sleep achieved and O2 nadir of 88%.  He may need supplemental oxygen and home BiPAP of 24/20 cm.  His residual AHI on 23/19 cm was 9.4/h.  He had significant REM rebound on PAP therapy.    EEG: Review of the EEG showed no abnormal electrical discharges and symmetrical bihemispheric findings.    EKG: The EKG revealed normal sinus rhythm (NSR).  His average heart rate during the baseline portion of the study was 73 bpm and 66 bpm during the titration portion of the study.  AUDIO/VIDEO REVIEW: The audio and video review did not show any abnormal or unusual behaviors, movements, phonations or vocalizations. The patient took 1 restroom breaks. Snoring was noted, in the moderate range prior to PAP therapy and significantly improved with BiPAP therapy.  POST-STUDY QUESTIONNAIRE: Post study, the patient indicated, that sleep was better than usual.   IMPRESSION:    1. Severe obstructive Sleep Apnea (OSA) 2. Nocturnal hypoxemia 3. Dysfunctions associated with sleep stages or arousal from sleep  RECOMMENDATIONS:    1. This patient has severe/critical obstructive sleep apnea. The patient qualified for an emergency split sleep study per AASM standards.  His baseline AHI was 115.5/h, O2 nadir 53%.  There was significant nocturnal hypoxemia. I recommend an overnight pulse oximetry test, once patient is establish on home BiPAP therapy. Please note, that the absence of REM sleep during the baseline portion of the study may have even underestimated the severity of his sleep disordered breathing.  Standard CPAP therapy alone was not  enough to significantly improve his sleep disordered breathing.  He required very high pressures, BiPAP of 23/19 cm or BiPAP of 24/20 cm is recommended for home use via large fullface mask from [employer].  He may require supplemental oxygen with BiPAP therapy.  He may benefit from returning for a designated BiPAP titration study.  Aggressive weight loss is very highly recommended for this patient.  Sleeping on his sides is also recommended for this patient to reduce the severity of his residual sleep disordered breathing.  His severe/critical obstructive sleep apnea was not fully eliminated during this titration study albeit significantly improved.  2. This study shows sleep fragmentation and abnormal sleep stage percentages; these are nonspecific findings and per se  do not signify an intrinsic sleep disorder or a cause for the patient's sleep-related symptoms. Causes include (but are not limited to) the first night effect of the sleep study, circadian rhythm disturbances, medication effect or an underlying mood disorder or medical problem.  He had evidence of significant REM rebound on PAP therapy. 3. The patient should be cautioned not to drive, work at heights, or operate dangerous or heavy equipment when tired or sleepy. Review and reiteration of good sleep hygiene measures should be pursued with any patient. 4. The patient will be seen in follow-up in the sleep clinic at [facility] for discussion of the test results, symptom and treatment compliance review, further management strategies, etc. The referring provider will be notified of the test results.   I certify that I have reviewed the entire raw data recording prior to the issuance of this report in accordance with the Standards of Accreditation of the American Academy of Sleep Medicine (AASM).  [name], MD, PhD Medical Director, [facility]) Diplomat, ABPN (Neurology and Sleep)            Technical Report:   [facility] Split Summary    General Information  Name: Brent Cruz, Brent Cruz BMI: 60.45 Physician: [name], MD  ID: [id number] Height: 65.0 in Technician: Margaretann Loveless, RPSGT  Sex: Male Weight: 343.0 lb Record: [id number]  Age: 7 [1970-05-27] Date: 12/17/2022     Medical & Medication History    52 year old male with an underlying medical history of asthma, Bell's palsy, ocular myasthenia, hypertension, and morbid obesity with a BMI of over 50, who reports snoring and excessive daytime somnolence. His Epworth sleepiness score is. He reports doing a home sleep test a couple of years ago through PCP but did not receive the results, reports that he was supposed to get a CPAP machine through [employer] but never got 1. Norvasc, Lipitor, Zestril, Deltasone, Mesitonon, Vitamin D, Drisdol   Sleep Disorder      Comments   The patient came into the sleep lab for a PSG. The patient was split before having 2 hrs of TST due to SPO2 dropping below 65%. The patient qualified for an emergency split. The patient was fitted with F&P Evora and Vitera (FFM). Study was started with F&P Vitera (FFM) size Med however, during the night the interface started to leak. The patient was then switched to F&P Evora (FFM) size Large. This mask was a good fit to face and the patient tolerated the mask well. CPAP was started at [facility] with EPR of 1 due to the patient feeling like a lower pressure was not enough. CPAP was increased to 17cmH2O with EPR of 2 due to respiratory events. The patient was then switched to BiPAP 19/15cmH2O due to continuous events and extremely low desats. BiPAP was titrated up to 24/20cmH2O. The patient had REM rebound. One restroom break. EKG did not show any obvious cardiac arrhythmias. The patient had moderate snoring before snoring was abolished with Bipap. Respiratory events scored with a 3% desat. The patient slept supine and  lateral. All sleep stages witnessed.     Baseline Sleep Stage Information Baseline start time: 10:02:53 PM Baseline end time: 11:31:25 PM   Time Total Supine Side Prone Upright  Recording 1h 4.24m 1h 4.74m 0h 0.39m 0h 0.53m 0h 0.74m  Sleep 0h 40.67m 0h 40.37m 0h 0.15m 0h 0.66m 0h 0.82m   Latency N1 N2 N3 REM Onset Per. Slp. Eff.  Actual 0h 0.39m  0h 2.2m 0h 0.32m 0h 0.54m 0h 9.59m 0h 47.71m 62.02%   Stg Dur Wake N1 N2 N3 REM  Total 24.5 2.5 37.5 0.0 0.0  Supine 24.5 2.5 37.5 0.0 0.0  Side 0.0 0.0 0.0 0.0 0.0  Prone 0.0 0.0 0.0 0.0 0.0  Upright 0.0 0.0 0.0 0.0 0.0   Stg % Wake N1 N2 N3 REM  Total 38.0 6.3 93.8 0.0 0.0  Supine 38.0 6.3 93.8 0.0 0.0  Side 0.0 0.0 0.0 0.0 0.0  Prone 0.0 0.0 0.0 0.0 0.0  Upright 0.0 0.0 0.0 0.0 0.0    CPAP Sleep Stage Information CPAP start time: 11:31:25 PM CPAP end time: 05:00:48 AM   Time Total Supine Side Prone Upright  Recording (TRT) 5h 29.33m 1h 19.53m 4h 10.41m 0h 0.43m 0h 0.20m  Sleep (TST) 4h 10.21m 0h 43.36m 3h 26.37m 0h 0.75m 0h 0.73m   Latency N1 N2 N3 REM Onset Per. Slp. Eff.  Actual 0h 0.73m 0h 0.56m 4h 35.23m 1h 7.68m 0h 22.15m 0h 51.24m 75.87%   Stg Dur Wake N1 N2 N3 REM  Total 79.5 11.5 86.0 32.0 120.5  Supine 35.5 6.0 37.5 0.0 0.0  Side 44.0 5.5 48.5 32.0 120.5  Prone 0.0 0.0 0.0 0.0 0.0  Upright 0.0 0.0 0.0 0.0 0.0   Stg % Wake N1 N2 N3 REM  Total 24.1 4.6 34.4 12.8 48.2  Supine 10.8 2.4 15.0 0.0 0.0  Side 13.4 2.2 19.4 12.8 48.2  Prone 0.0 0.0 0.0 0.0 0.0  Upright 0.0 0.0 0.0 0.0 0.0    Baseline Respiratory Information Apnea Summary Sub Supine Side Prone Upright  Total 69 Total 69 69 0 0 0    REM 0 0 0 0 0    NREM 69 69 0 0 0  Obs 69 REM 0 0 0 0 0    NREM 69 69 0 0 0  Mix 0 REM 0 0 0 0 0    NREM 0 0 0 0 0  Cen 0 REM 0 0 0 0 0    NREM 0 0 0 0 0   Rera Summary Sub Supine Side Prone Upright  Total 0 Total 0 0 0 0 0    REM 0 0 0 0 0    NREM 0 0 0 0 0   Hypopnea Summary Sub Supine Side Prone Upright  Total 8 Total 8 8 0 0 0    REM 0 0 0 0  0    NREM 8 8 0 0 0   4% Hypopnea Summary Sub Supine Side Prone Upright  Total (4%) 8 Total 8 8 0 0 0    REM 0 0 0 0 0    NREM 8 8 0 0 0     AHI Total Obs Mix Cen  115.50 Apnea 103.50 103.50 0.00 0.00   Hypopnea 12.00 -- -- --  115.50 Hypopnea (4%) 12.00 -- -- --    Total Supine Side Prone Upright  Position AHI 115.50 115.50 0.00 0.00 0.00  REM AHI 0.00   NREM AHI 115.50   Position RDI 115.50 115.50 0.00 0.00 0.00  REM RDI 0.00   NREM RDI 115.50    4% Hypopnea Total Supine Side Prone Upright  Position AHI (4%) 115.50 115.50 0.00 0.00 0.00  REM AHI (4%) 0.00   NREM AHI (4%) 115.50   Position RDI (4%) 115.50 115.50 0.00 0.00 0.00  REM RDI (4%) 0.00   NREM RDI (4%) 115.50    CPAP Respiratory Information  Apnea Summary Sub Supine Side Prone Upright  Total 99 Total 99 50 49 0 0    REM 11 0 11 0 0    NREM 88 50 38 0 0  Obs 97 REM 11 0 11 0 0    NREM 86 50 36 0 0  Mix 1 REM 0 0 0 0 0    NREM 1 0 1 0 0  Cen 1 REM 0 0 0 0 0    NREM 1 0 1 0 0   Rera Summary Sub Supine Side Prone Upright  Total 0 Total 0 0 0 0 0    REM 0 0 0 0 0    NREM 0 0 0 0 0   Hypopnea Summary Sub Supine Side Prone Upright  Total 51 Total 51 7 44 0 0    REM 27 0 27 0 0    NREM 24 7 17  0 0   4% Hypopnea Summary Sub Supine Side Prone Upright  Total (4%) 31 Total 31 6 25  0 0    REM 15 0 15 0 0    NREM 16 6 10  0 0     AHI Total Obs Mix Cen  36.00 Apnea 23.76 23.28 0.24 0.24   Hypopnea 12.24 -- -- --  31.20 Hypopnea (4%) 7.44 -- -- --    Total Supine Side Prone Upright  Position AHI 36.00 78.62 27.02 0.00 0.00  REM AHI 18.92   NREM AHI 51.89   Position RDI 36.00 78.62 27.02 0.00 0.00  REM RDI 18.92   NREM RDI 51.89    4% Hypopnea Total Supine Side Prone Upright  Position AHI (4%) 31.20 77.24 21.50 0.00 0.00  REM AHI (4%) 12.95   NREM AHI (4%) 48.19   Position RDI (4%) 31.20 77.24 21.50 0.00 0.00  REM RDI (4%) 12.95   NREM RDI (4%) 48.19    Desaturation Information (Baseline)  <100%  <90% <80% <70% <60% <50% <40%  Supine 96 96 71 37 8 0 0  Side 0 0 0 0 0 0 0  Prone 0 0 0 0 0 0 0  Upright 0 0 0 0 0 0 0  Total 96 96 71 37 8 0 0  Desaturation threshold setting: 3% Minimum desaturation setting: 10 seconds SaO2 nadir: 53% The longest event was a 32 sec obstructive Apneawith a minimum SaO2 of 57%. The lowest SaO2 was 53% associated with a 29 sec obstructive Apnea. Awakening/Arousal Information (Baseline) # of Awakenings 10  Wake after sleep onset 15.67m  Wake after persistent sleep 6.37m   Arousal Assoc. Arousals Index  Apneas 58 87.0  Hypopneas 7 10.5  Leg Movements 0 0.0  Snore 0.0 0.0  PTT Arousals 0 0.0  Spontaneous 3 4.5  Total 68 102.0   Desaturation Information (CPAP)  <100% <90% <80% <70% <60% <50% <40%  Supine 93 87 59 29 8 0 0  Side 129 85 64 37 17 0 0  Prone 0 0 0 0 0 0 0  Upright 0 0 0 0 0 0 0  Total 222 172 123 66 25 0 0  Desaturation threshold setting: 3% Minimum desaturation setting: 10 seconds SaO2 nadir: 50% The longest event was a 72 sec obstructive Hypopnea with a minimum SaO2 of 77%. The lowest SaO2 was 50% associated with a 64 sec obstructive Apnea. Awakening/Arousal Information (CPAP) # of Awakenings 25  Wake after sleep onset 57.32m  Wake after persistent sleep 44.29m   Arousal Assoc. Arousals Index  Apneas 77 18.5  Hypopneas 20 4.8  Leg Movements 0 0.0  Snore 0.0 0.0  PTT Arousals 0 0.0  Spontaneous 21 5.0  Total 118 28.3     EKG Rates (Baseline) EKG Avg Max Min  Awake 75 83 68  Asleep 73 78 67  EKG Events: N/A Myoclonus Information (Baseline) PLMS LMs Index  Total LMs during PLMS 0 0.0  LMs w/ Microarousals 0 0.0   LM LMs Index  w/ Microarousal 0 0.0  w/ Awakening 0 0.0  w/ Resp Event 0 0.0  Spontaneous 0 0.0  Total 0 0.0   EKG Rates (CPAP) EKG Avg Max Min  Awake 67 91 56  Asleep 66 87 55  EKG Events: N/A Myoclonus Information (CPAP) PLMS LMs Index  Total LMs during PLMS 0 0.0  LMs w/ Microarousals 0 0.0    LM LMs Index  w/ Microarousal 0 0.0  w/ Awakening 0 0.0  w/ Resp Event 0 0.0  Spontaneous 0 0.0  Total 0 0.0      Titration Table:  [facility] CPAP/Bilevel Report    General Information  Name: Brent Cruz, Brent Cruz BMI: 57 Physician: [name]  ID: [id number] Height: 65 in Technician: Margaretann Loveless  Sex: Male Weight: 343 lb Record: [id number]  Age: 73 [[birth year]] Date: 12/17/2022 Scorer: Zenon Mayo Fields   Recommended Settings IPAP: N/A cmH20 EPAP: N/A cmH2O AHI: N/A AHI (4%): N/A   Pressure IPAP/EPAP 00 07 09 10 11 13 14 19  / 15   O2 Vol 0.0 0.0 0.0 0.0 0.0 0.0 0.0 0.0  Time TRT 64.76m 34.68m 6.43m 11.34m 8.46m 22.28m 18.50m 12.21m   TST 40.65m 2.79m 6.51m 8.10m 8.87m 11.6m 12.31m 11.38m  Sleep Stage % Wake 38.0 92.8 7.7 26.1 0.0 50.0 33.3 4.2   % REM 0.0 0.0 0.0 0.0 0.0 0.0 54.2 8.7   % N1 6.3 60.0 16.7 17.6 0.0 4.5 12.5 4.3   % N2 93.8 40.0 83.3 82.4 100.0 95.5 33.3 87.0   % N3 0.0 0.0 0.0 0.0 0.0 0.0 0.0 0.0  Respiratory Total Events 77 5 13 14 10 14 10 10    Obs. Apn. 69 5 11 14 10 9 8 10    Mixed Apn. 0 0 0 0 0 0 0 0   Cen. Apn. 0 0 0 0 0 0 0 0   Hypopneas 8 0 2 0 0 5 2 0   AHI 115.50 120.00 130.00 98.82 70.59 76.36 50.00 52.17   Supine AHI 115.50 120.00 130.00 98.82 70.59 60.00 0.00 0.00   Prone AHI 0.00 0.00 0.00 0.00 0.00 0.00 0.00 0.00   Side AHI 0.00 0.00 0.00 0.00 0.00 80.00 50.00 52.17  Respiratory (4%) Hypopneas (4%) 8.00 0.00 2.00 0.00 0.00 5.00 2.00 0.00   AHI (4%) 115.50 120.00 130.00 98.82 70.59 76.36 50.00 52.17   Supine AHI (4%) 115.50 120.00 130.00 98.82 70.59 60.00 0.00 0.00   Prone AHI (4%) 0.00 0.00 0.00 0.00 0.00 0.00 0.00 0.00   Side AHI (4%) 0.00 0.00 0.00 0.00 0.00 80.00 50.00 52.17  Desat Profile <= 90% 54.31m 13.74m 5.65m 9.70m 7.55m 14.40m 14.33m 10.41m   <= 80% 31.40m 3.13m 3.76m 6.8m 6.23m 9.31m 11.8m 9.19m   <= 70% 16.62m 1.48m 0.45m 3.17m 4.2m 5.68m 7.22m 6.35m   <= 60% 5.78m 0.17m 0.23m 0.41m 1.80m 2.31m 3.30m 1.20m  Arousal Index Apnea  87.0 96.0 70.0 91.8 77.6 32.7 30.0 20.9   Hypopnea 10.5 0.0 20.0 0.0 0.0 16.4 15.0 0.0   LM  0.0 0.0 0.0 0.0 0.0 0.0 0.0 0.0   Spontaneous 4.5 48.0 10.0 14.1 0.0 5.5 15.0 5.2   Pressure IPAP/EPAP 16 20 / 16 17 21  / 17 22 / 18 23 / 19 24 / 20   O2 Vol 0.0 0.0 0.0 0.0 0.0 0.0 0.0  Time TRT 16.46m 13.2m 34.7m 27.44m 46.42m 38.65m 40.57m   TST 7.41m 13.26m 19.57m 27.32m 45.43m 38.70m 40.47m  Sleep Stage % Wake 57.6 0.0 43.5 1.8 3.2 0.0 0.0   % REM 0.0 100.0 0.0 100.0 76.7 100.0 0.0   % N1 28.6 0.0 15.4 0.0 0.0 0.0 0.0   % N2 71.4 0.0 84.6 0.0 23.3 0.0 20.0   % N3 0.0 0.0 0.0 0.0 0.0 0.0 80.0  Respiratory Total Events 7 11 21 10 8 6 11    Obs. Apn. 6 7 16  0 1 0 0   Mixed Apn. 0 0 1 0 0 0 0   Cen. Apn. 0 0 0 0 0 0 1   Hypopneas 1 4 4 10 7 6 10    AHI 60.00 50.77 64.62 22.22 10.67 9.35 16.50   Supine AHI 60.00 0.00 40.00 0.00 0.00 0.00 0.00   Prone AHI 0.00 0.00 0.00 0.00 0.00 0.00 0.00   Side AHI 0.00 50.77 85.71 22.22 10.67 9.35 16.50  Respiratory (4%) Hypopneas (4%) 0.00 4.00 4.00 7.00 1.00 3.00 3.00   AHI (4%) 51.43 50.77 64.62 15.56 2.67 4.68 6.00   Supine AHI (4%) 51.43 0.00 40.00 0.00 0.00 0.00 0.00   Prone AHI (4%) 0.00 0.00 0.00 0.00 0.00 0.00 0.00   Side AHI (4%) 0.00 50.77 85.71 15.56 2.67 4.68 6.00  Desat Profile <= 90% 9.77m 12.25m 16.70m 9.34m 1.37m 0.46m 1.30m   <= 80% 3.55m 9.64m 7.47m 3.60m 0.15m 0.47m 0.46m   <= 70% 1.17m 7.84m 1.75m 0.23m 0.38m 0.19m 0.55m   <= 60% 0.51m 4.52m 0.109m 0.15m 0.78m 0.7m 0.90m  Arousal Index Apnea 42.9 27.7 43.1 0.0 1.3 0.0 0.0   Hypopnea 8.6 9.2 12.3 8.9 0.0 1.6 0.0   LM 0.0 0.0 0.0 0.0 0.0 0.0 0.0   Spontaneous 42.9 0.0 3.1 2.2 4.0 0.0 1.5

## 2023-02-25 ENCOUNTER — Telehealth: Payer: Self-pay | Admitting: Neurology

## 2023-02-25 NOTE — Telephone Encounter (Signed)
 Pt called to verify appointment

## 2023-02-26 NOTE — Progress Notes (Signed)
 [facility] [address] [place]. [place] [address] [phone]       OFFICE FOLLOW UP NOTE  Mr. Brent Cruz Date of Birth:  [birth year] Medical Record Number:  [id number]   Reason for visit: Initial CPAP follow-up    SUBJECTIVE:   CHIEF COMPLAINT:  Chief Complaint  Patient presents with   Follow-up    Pt in 8 pt here for cpap f/u Pt states doing well on pap machine Pt states mask not fitting well on face Pt needs refill on prednisone      Follow-up visit:   Brief HPI:   Brent Cruz is a 53 y.o. male with PMH of Bell's palsy, ocular myasthenia, HTN and morbid obesity with BMI of over 50 who was evaluated by Dr. Omar Bibber on 08/27/2022 for concern of underlying sleep apnea with reported snoring and excessive daytime somnolence.  Underwent split-night study 12/17/2022 which showed severe/critical obstructive sleep apnea qualified for emergency split study with baseline AHI 115.5/h and O2 nadir 53%.  Standard CPAP therapy alone was not enough to significantly improve apnea and switched to BiPAP therapy, noted residual sleep disordered breathing but events much improved, noted possibly needing supplemental oxygen on BiPAP therapy and recommended ONO once on BiPAP.  BiPAP set up 01/03/2023.    Interval history:  Compliance report as below showing excellent usage and optimal residual AHI.  He reports significant improvement since starting BiPAP, has been sleeping better and having more energy levels during the day.  Currently using oro-nasal mask, tolerating well but does have leaks around his nose and mouth.  Continues to work on weight loss, has lost about 20 pounds since prior visit, has been working on exercise, better dietary choices and use of Mounjaro. ESS 5/24. DME [employer].         ROS:   14 system review of systems performed and negative with exception of those listed in HPI  PMH:  Past Medical History:  Diagnosis Date   Asthma    as a child    Bell's palsy    Recurrent   COVID-19 10/2018   Hypertension    Myasthenia gravis with exacerbation (HCC) 03/30/2014   Obesity     PSH:  Past Surgical History:  Procedure Laterality Date    OPEN VENTRAL HERNIA REPAIR WITH MESH (N/A Abdomen)  04/26/2020   COLONOSCOPY WITH PROPOFOL  N/A 09/12/2020   Procedure: COLONOSCOPY WITH PROPOFOL ;  Surgeon: Normie Becton., MD;  Location: [facility];  Service: Gastroenterology;  Laterality: N/A;   POLYPECTOMY  09/12/2020   Procedure: POLYPECTOMY;  Surgeon: Mansouraty, Albino Alu., MD;  Location: [facility];  Service: Gastroenterology;;   tonsil abscess     VENTRAL HERNIA REPAIR N/A 04/26/2020   Procedure: OPEN VENTRAL HERNIA REPAIR WITH MESH;  Surgeon: Anda Bamberg, MD;  Location: [facility];  Service: General;  Laterality: N/A;    Social History:  Social History   Socioeconomic History   Marital status: Single    Spouse name: Not on file   Number of children: 2   Years of education: College   Highest education level: Not on file  Occupational History   Occupation: Driver [employer]  Tobacco Use   Smoking status: Former    Current packs/day: 0.00    Types: Cigarettes    Quit date: 02/12/1989    Years since quitting: 34.0   Smokeless tobacco: Never  Vaping Use   Vaping status: Never Used  Substance and Sexual Activity   Alcohol use: Yes  Comment: Consumes alcohol on special occasion   Drug use: No   Sexual activity: Not on file  Other Topics Concern   Not on file  Social History Narrative   Patient is right handed.   Patient does not drink caffeine.   Pt lives with dad    Pt works    Museum/gallery exhibitions officer: Not on Ship broker Insecurity: Not on file  Transportation Needs: Not on file  Physical Activity: Not on file  Stress: Not on file  Social Connections: Not on file  Intimate Partner Violence: Not on file    Family History:  Family History  Problem Relation Age of Onset   Healthy Mother     Glaucoma Father    Diabetes Paternal Grandfather    Colon cancer Neg Hx    Esophageal cancer Neg Hx    Inflammatory bowel disease Neg Hx    Liver disease Neg Hx    Pancreatic cancer Neg Hx    Rectal cancer Neg Hx    Stomach cancer Neg Hx    Sleep apnea Neg Hx     Medications:   Current Outpatient Medications on File Prior to Visit  Medication Sig Dispense Refill   amLODipine (NORVASC) 5 MG tablet Take 5 mg by mouth daily.     atorvastatin (LIPITOR) 20 MG tablet Take 20 mg by mouth at bedtime.     lisinopril  (ZESTRIL ) 20 MG tablet Take 1 tablet (20 mg total) by mouth daily. 30 tablet 3   MOUNJARO 5 MG/0.5ML Pen Inject 7.5 mg into the skin once a week.     predniSONE  (DELTASONE ) 20 MG tablet Take 1 tablet (20 mg total) by mouth daily with breakfast. 90 tablet 4   pyridostigmine  (MESTINON ) 60 MG tablet Take 1 tablet (60 mg total) by mouth 3 (three) times daily. 270 tablet 4   Vitamin D, Ergocalciferol, (DRISDOL) 1.25 MG (50000 UNIT) CAPS capsule Take 50,000 Units by mouth once a week.     No current facility-administered medications on file prior to visit.    Allergies:  No Known Allergies    OBJECTIVE:  Physical Exam  Vitals:   02/27/23 1515  BP: 120/60  Pulse: 84  Weight: (!) 326 lb 3.2 oz (148 kg)  Height: 5\' 5"  (1.651 m)   Body mass index is 54.28 kg/m. No results found.  General: Morbidly obese very pleasant middle-age African-American male, seated, in no evident distress Head: head normocephalic and atraumatic.   Neck: supple with no carotid or supraclavicular bruits Cardiovascular: regular rate and rhythm, no murmurs Musculoskeletal: no deformity Skin:  no rash/petichiae Vascular:  Normal pulses all extremities   Neurologic Exam Mental Status: Awake and fully alert. Oriented to place and time. Recent and remote memory intact. Attention span, concentration and fund of knowledge appropriate. Mood and affect appropriate.  Cranial Nerves: Pupils equal,  briskly reactive to light. Extraocular movements full without nystagmus. Visual fields full to confrontation. Hearing intact.  Motor: Normal bulk and tone. Normal strength in all tested extremity muscles Gait and Station: Arises from chair without difficulty. Stance is normal. Gait demonstrates normal stride length and balance without use of AD.  Reflexes: 1+ and symmetric. Toes downgoing.         ASSESSMENT/PLAN: Brent Cruz is a 53 y.o. year old male    OSA on BiPAP:  Nocturnal hypoxemia:  Compliance report shows satisfactory usage with optimal residual AHI.  Continue current pressure settings.  Will request mask  refitting with DME due to high leak rate.  Request completion of ONO to ensure resolution of nocturnal hypoxemia with BiPAP use.  Discussed continued nightly usage with ensuring greater than 4 hours nightly for optimal benefit and per insurance purposes.  Continue to follow with DME company for any needed supplies or CPAP related concerns     Follow up in 6 months or call earlier if needed   CC:  PCP: Charle Congo, MD    I spent 25 minutes of face-to-face and non-face-to-face time with patient.  This included previsit chart review, lab review, study review, order entry, electronic health record documentation, patient education and discussion regarding above diagnoses and treatment plan and answered all other questions to patient's satisfaction  Brent Cruz, [facility]  [facility] [address] [place], [place] [address]  Phone [phone] Fax [phone] Note: This document was prepared with digital dictation and possible smart phrase technology. Any transcriptional errors that result from this process are unintentional.

## 2023-02-27 ENCOUNTER — Encounter: Payer: Self-pay | Admitting: Adult Health

## 2023-02-27 ENCOUNTER — Ambulatory Visit: Payer: Commercial Managed Care - PPO | Admitting: Adult Health

## 2023-02-27 VITALS — BP 120/60 | HR 84 | Ht 65.0 in | Wt 326.2 lb

## 2023-02-27 DIAGNOSIS — G4730 Sleep apnea, unspecified: Secondary | ICD-10-CM | POA: Diagnosis not present

## 2023-02-27 DIAGNOSIS — Z9989 Dependence on other enabling machines and devices: Secondary | ICD-10-CM

## 2023-02-27 DIAGNOSIS — G4734 Idiopathic sleep related nonobstructive alveolar hypoventilation: Secondary | ICD-10-CM

## 2023-02-27 NOTE — Patient Instructions (Addendum)
 Your Plan:  Continue nightly use of BiPAP for adequate sleep apnea management   Will request mask refitting through [employer] to help with leaks   You will be called to complete an overnight oximetry test to ensure good oxygen levels with use of BiPAP - please ensure you use your BiPAP during this test       Follow up in 6 months or call earlier if needed       Thank you for coming to see us  at [facility]. I hope we have been able to provide you high quality care today.  You may receive a patient satisfaction survey over the next few weeks. We would appreciate your feedback and comments so that we may continue to improve ourselves and the health of our patients.

## 2023-04-15 ENCOUNTER — Ambulatory Visit: Payer: Commercial Managed Care - PPO | Admitting: Diagnostic Neuroimaging

## 2023-07-30 ENCOUNTER — Other Ambulatory Visit: Payer: Self-pay | Admitting: Diagnostic Neuroimaging

## 2023-08-28 ENCOUNTER — Ambulatory Visit (INDEPENDENT_AMBULATORY_CARE_PROVIDER_SITE_OTHER): Payer: Commercial Managed Care - PPO | Admitting: Adult Health

## 2023-08-28 ENCOUNTER — Encounter: Payer: Self-pay | Admitting: Adult Health

## 2023-08-28 VITALS — BP 147/88 | HR 79 | Ht 65.0 in | Wt 305.0 lb

## 2023-08-28 DIAGNOSIS — Z9989 Dependence on other enabling machines and devices: Secondary | ICD-10-CM | POA: Diagnosis not present

## 2023-08-28 DIAGNOSIS — G4734 Idiopathic sleep related nonobstructive alveolar hypoventilation: Secondary | ICD-10-CM

## 2023-08-28 DIAGNOSIS — G4730 Sleep apnea, unspecified: Secondary | ICD-10-CM

## 2023-08-28 NOTE — Patient Instructions (Addendum)
 Your Plan:  Continue nightly use of CPAP for adequate sleep apnea management  Follow up with [employer] for mask refitting as persistent leaks can be causing dry mouth   Can try to add an in room humidifier to further help with dry mouth       Follow up in 1 year or call earlier if needed     Thank you for coming to see us  at [facility]. I hope we have been able to provide you high quality care today.  You may receive a patient satisfaction survey over the next few weeks. We would appreciate your feedback and comments so that we may continue to improve ourselves and the health of our patients.

## 2023-08-28 NOTE — Progress Notes (Signed)
 [facility] [address] [place]. [place] [address] [phone]       OFFICE FOLLOW UP NOTE  Mr. Brent Cruz Date of Birth:  [birth year] Medical Record Number:  [id number]   Reason for visit: CPAP follow-up    SUBJECTIVE:   CHIEF COMPLAINT:  Chief Complaint  Patient presents with   Follow-up    6 month follow up cpap - pt has using machine every night, notice that his having a lot dry mouth after sleeping with machine at night,EES toal is 2    Follow-up visit:  Prior visit: 02/27/2023  Brief HPI:   Brent Cruz is a 53 y.o. male with PMH of Bell's palsy, ocular myasthenia, HTN and morbid obesity with BMI of over 50 who was evaluated by Dr. Buck on 08/27/2022 for concern of underlying sleep apnea with reported snoring and excessive daytime somnolence.  Underwent split-night study 12/17/2022 which showed severe/critical obstructive sleep apnea qualified for emergency split study with baseline AHI 115.5/h and O2 nadir 53%.  Standard CPAP therapy alone was not enough to significantly improve apnea and switched to BiPAP therapy, noted residual sleep disordered breathing but events much improved, noted possibly needing supplemental oxygen on BiPAP therapy and recommended ONO once on BiPAP.  BiPAP set up 01/03/2023.  At prior visit, noted excellent compliance and optimal residual AHI.  Reported improvement of sleep-related complaints.  Did complain of leaks with airfit F30 mask and referred back to DME for mask refitting.  Reported continued weight loss.  ESS 5/24.   Interval history:  Returns today for follow up visit. Reports continued benefit with CPAP therapy in regards to sleep quality and daytime energy levels.  Overall tolerating well with ongoing use of FFM but continues to have difficulty getting a good seal. Reports DME did sent him different supplies for his mask but still having issues getting a good seal and getting the Velcro to stay intact.  He does  complain of excessive dry mouth upon awakening, will run out of water in chamber prior to waking up.  Remains on Mounjaro and reports continued weight loss, has had a total of 40 pounds since initial testing 1 year ago.  ESS 2/24.  No further questions or concerns at this time.        ROS:   14 system review of systems performed and negative with exception of those listed in HPI  PMH:  Past Medical History:  Diagnosis Date   Asthma    as a child   Bell's palsy    Recurrent   COVID-19 10/2018   Hypertension    Myasthenia gravis with exacerbation (HCC) 03/30/2014   Obesity     PSH:  Past Surgical History:  Procedure Laterality Date    OPEN VENTRAL HERNIA REPAIR WITH MESH (N/A Abdomen)  04/26/2020   COLONOSCOPY WITH PROPOFOL  N/A 09/12/2020   Procedure: COLONOSCOPY WITH PROPOFOL ;  Surgeon: Wilhelmenia Aloha Raddle., MD;  Location: [facility];  Service: Gastroenterology;  Laterality: N/A;   POLYPECTOMY  09/12/2020   Procedure: POLYPECTOMY;  Surgeon: Wilhelmenia Aloha Raddle., MD;  Location: [facility];  Service: Gastroenterology;;   tonsil abscess     VENTRAL HERNIA REPAIR N/A 04/26/2020   Procedure: OPEN VENTRAL HERNIA REPAIR WITH MESH;  Surgeon: Paola Dreama SAILOR, MD;  Location: [facility];  Service: General;  Laterality: N/A;    Social History:  Social History   Socioeconomic History   Marital status: Single    Spouse name: Not on file   Number of  children: 2   Years of education: College   Highest education level: Not on file  Occupational History   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   Patient does not drink caffeine.   Pt lives with dad    Pt works    Museum/gallery exhibitions officer: Not on Ship broker Insecurity: Not on file  Transportation Needs: Not on file  Physical Activity: Not on file  Stress: Not on file  Social Connections: Not on file  Intimate Partner Violence: Not on file    Family History:  Family History  Problem Relation Age of Onset   Healthy Mother    Glaucoma Father    Diabetes Paternal Grandfather    Colon cancer Neg Hx    Esophageal cancer Neg Hx    Inflammatory bowel disease Neg Hx    Liver disease Neg Hx    Pancreatic cancer Neg Hx    Rectal cancer Neg Hx    Stomach cancer Neg Hx    Sleep apnea Neg Hx     Medications:   Current Outpatient Medications on File Prior to Visit  Medication Sig Dispense Refill   atorvastatin (LIPITOR) 20 MG tablet Take 20 mg by mouth at bedtime.     losartan-hydrochlorothiazide  (HYZAAR) 50-12.5 MG tablet Take 1 tablet by mouth daily.     metFORMIN (GLUCOPHAGE) 500 MG tablet Take 500 mg by mouth daily with breakfast.     MOUNJARO 10 MG/0.5ML Pen      MOUNJARO 5 MG/0.5ML Pen Inject 7.5 mg into the skin once a week.     predniSONE  (DELTASONE ) 20 MG tablet Take 1 tablet (20 mg total) by mouth daily with breakfast. Follow-up appointment needed for further refills. 30 tablet 0   pyridostigmine  (MESTINON ) 60 MG tablet Take 1 tablet (60 mg total) by mouth 3 (three) times daily. 270 tablet 4   Vitamin D, Ergocalciferol, (DRISDOL) 1.25 MG (50000 UNIT) CAPS capsule Take 50,000 Units by mouth once a week.     No current facility-administered medications on file prior to visit.    Allergies:  No Known Allergies    OBJECTIVE:  Physical Exam  Vitals:   08/28/23 1450  BP: (!) 147/88  Pulse: 79  Weight: (!) 305 lb (138.3 kg)  Height: 5' 5 (1.651 m)    Body mass index is 50.75 kg/m. No results found.  General: Morbidly obese very  pleasant middle-age African-American male, seated, in no evident distress Head: head normocephalic and atraumatic.   Neck: supple with no carotid or supraclavicular bruits Cardiovascular: regular rate and rhythm, no murmurs Musculoskeletal: no deformity Skin:  no rash/petichiae Vascular:  Normal pulses all extremities   Neurologic Exam Mental Status: Awake and fully alert. Oriented to place and time. Recent and remote memory intact. Attention span, concentration and fund of knowledge appropriate. Mood and affect appropriate.  Cranial Nerves: Pupils equal, briskly reactive to light. Extraocular movements full without  nystagmus. Visual fields full to confrontation. Hearing intact.  Motor: Normal bulk and tone. Normal strength in all tested extremity muscles Gait and Station: Arises from chair without difficulty. Stance is normal. Gait demonstrates normal stride length and balance without use of AD.  Reflexes: 1+ and symmetric. Toes downgoing.         ASSESSMENT/PLAN: Brent Cruz is a 53 y.o. year old male    OSA on BiPAP:  Nocturnal hypoxemia:  Compliance report shows satisfactory usage with optimal residual AHI.   Continue current pressure settings of 23/19.   Will request mask refitting again with DME due to high leak rate likely causing dry mouth. Also discussed adding in room humidifier to further help Will place new order for completion of ONO to ensure resolution of nocturnal hypoxemia with BiPAP use - was not contacted to complete after prior visit for unclear reason Congratulated on current weight loss accomplishments of approximately 40 pounds over the past year.  Encouraged further weight loss and consider repeat testing once at goal.  Discussed continued nightly usage with ensuring greater than 4 hours nightly for optimal benefit and per insurance purposes.   DME [employer] [facility] Continue to follow with DME company for any needed supplies or CPAP related concerns CPAP set up  12/2022     Follow up in 1 year or call earlier if needed   CC:  PCP: Shelda Atlas, MD    I personally spent a total of 30 minutes in the care of the patient today including preparing to see the patient, performing a medically appropriate exam/evaluation, counseling and educating, placing orders, and documenting clinical information in the EHR.   Harlene Bogaert, AGNP-BC  [facility] [address] [place], [place] [address]  Phone [phone] Fax [phone] Note: This document was prepared with digital dictation and possible smart phrase technology. Any transcriptional errors that result from this process are unintentional.

## 2023-08-29 NOTE — Progress Notes (Signed)
 Jaheem Hedgepath D, CMA  Joylene Bradley; Garcia, Patricia; Ziegler, Melissa; Tucker, Dolanda; Cain, [place] New orders have been placed for the above pt, DOB: 05/14/1970 Thanks

## 2023-09-19 ENCOUNTER — Other Ambulatory Visit: Payer: Self-pay | Admitting: Diagnostic Neuroimaging

## 2023-09-26 ENCOUNTER — Other Ambulatory Visit: Payer: Self-pay

## 2023-09-26 MED ORDER — PREDNISONE 20 MG PO TABS: 20.0000 mg | ORAL_TABLET | Freq: Every day | ORAL | 1 refills | Status: DC

## 2023-11-25 ENCOUNTER — Other Ambulatory Visit: Payer: Self-pay | Admitting: Diagnostic Neuroimaging

## 2024-02-02 ENCOUNTER — Other Ambulatory Visit: Payer: Self-pay | Admitting: Diagnostic Neuroimaging

## 2024-02-28 ENCOUNTER — Telehealth: Payer: Self-pay | Admitting: Diagnostic Neuroimaging

## 2024-02-28 NOTE — Telephone Encounter (Signed)
 Pt called stating that  He has a DOT every  year ans is requesting for letter that stated  he uses bi-PAP the patient stated I t normally  emailed to him the patient would like  to discuss getting that information sent to email. For DOT

## 2024-02-28 NOTE — Telephone Encounter (Signed)
 Patient contacted DME company and they faxed over Compliance report to DOT for the patient. They also notified him that information was sent over.

## 2024-09-02 ENCOUNTER — Ambulatory Visit: Admitting: Adult Health
